# Patient Record
Sex: Female | Born: 1975
Health system: Southern US, Community
[De-identification: ages and names within clinical notes are randomized; demographics above are authoritative.]

## PROBLEM LIST (undated history)

## (undated) DIAGNOSIS — Z789 Other specified health status: Secondary | ICD-10-CM

## (undated) DIAGNOSIS — I739 Peripheral vascular disease, unspecified: Secondary | ICD-10-CM

## (undated) HISTORY — DX: Peripheral vascular disease, unspecified: I73.9

## (undated) HISTORY — DX: Other specified health status: Z78.9

---

## 2009-03-30 ENCOUNTER — Inpatient Hospital Stay: Payer: Self-pay | Admitting: Obstetrics and Gynecology

## 2009-05-12 ENCOUNTER — Ambulatory Visit: Payer: Self-pay | Admitting: Obstetrics and Gynecology

## 2009-05-14 ENCOUNTER — Ambulatory Visit: Payer: Self-pay | Admitting: Obstetrics and Gynecology

## 2009-07-12 ENCOUNTER — Emergency Department: Payer: Self-pay | Admitting: Emergency Medicine

## 2009-08-05 ENCOUNTER — Emergency Department: Payer: Self-pay | Admitting: Emergency Medicine

## 2010-10-25 ENCOUNTER — Ambulatory Visit: Payer: Self-pay | Admitting: Internal Medicine

## 2011-06-06 ENCOUNTER — Emergency Department: Payer: Self-pay | Admitting: Emergency Medicine

## 2011-06-06 LAB — URINALYSIS, COMPLETE
Bilirubin,UR: NEGATIVE
Blood: NEGATIVE
Leukocyte Esterase: NEGATIVE
Nitrite: NEGATIVE
Protein: NEGATIVE
RBC,UR: 10 /HPF (ref 0–5)
Specific Gravity: 1.021 (ref 1.003–1.030)
Squamous Epithelial: 1
WBC UR: 1 /HPF (ref 0–5)

## 2011-06-06 LAB — PREGNANCY, URINE: Pregnancy Test, Urine: NEGATIVE m[IU]/mL

## 2012-03-16 ENCOUNTER — Emergency Department: Payer: Self-pay | Admitting: Emergency Medicine

## 2012-03-16 LAB — COMPREHENSIVE METABOLIC PANEL
Anion Gap: 5 — ABNORMAL LOW (ref 7–16)
Calcium, Total: 8.6 mg/dL (ref 8.5–10.1)
EGFR (African American): >60
EGFR (Non-African Amer.): >60
Glucose: 92 mg/dL (ref 65–99)
Osmolality: 272 (ref 275–301)
SGOT(AST): 18 U/L (ref 15–37)
SGPT (ALT): 21 U/L (ref 12–78)
Sodium: 136 mmol/L (ref 136–145)
Total Protein: 7.6 g/dL (ref 6.4–8.2)

## 2012-03-16 LAB — URINALYSIS, COMPLETE
Bacteria: NONE SEEN
Bilirubin,UR: NEGATIVE
Glucose,UR: NEGATIVE mg/dL (ref 0–75)
Ketone: NEGATIVE
Nitrite: NEGATIVE
Protein: NEGATIVE
RBC,UR: 5 /HPF (ref 0–5)
Specific Gravity: 1.02 (ref 1.003–1.030)
Squamous Epithelial: 4
WBC UR: 1 /HPF (ref 0–5)

## 2012-03-16 LAB — CBC
HGB: 14.2 g/dL (ref 12.0–16.0)
MCH: 30.2 pg (ref 26.0–34.0)
RBC: 4.7 10*6/uL (ref 3.80–5.20)
WBC: 10.2 10*3/uL (ref 3.6–11.0)

## 2012-03-16 LAB — PREGNANCY, URINE: Pregnancy Test, Urine: NEGATIVE m[IU]/mL

## 2012-07-15 ENCOUNTER — Ambulatory Visit: Payer: Self-pay | Admitting: Internal Medicine

## 2014-05-20 ENCOUNTER — Other Ambulatory Visit: Payer: Self-pay | Admitting: Obstetrics and Gynecology

## 2014-05-20 DIAGNOSIS — Z1231 Encounter for screening mammogram for malignant neoplasm of breast: Secondary | ICD-10-CM

## 2014-06-17 ENCOUNTER — Ambulatory Visit
Admission: RE | Admit: 2014-06-17 | Discharge: 2014-06-17 | Disposition: A | Discharge status: Home / Self Care | Payer: 59 | Source: Ambulatory Visit | Attending: Obstetrics and Gynecology | Admitting: Obstetrics and Gynecology

## 2014-06-17 DIAGNOSIS — Z1231 Encounter for screening mammogram for malignant neoplasm of breast: Secondary | ICD-10-CM | POA: Insufficient documentation

## 2015-08-20 ENCOUNTER — Other Ambulatory Visit: Payer: Self-pay | Admitting: Internal Medicine

## 2015-08-20 DIAGNOSIS — Z1231 Encounter for screening mammogram for malignant neoplasm of breast: Secondary | ICD-10-CM

## 2015-09-07 ENCOUNTER — Other Ambulatory Visit: Payer: Self-pay | Admitting: Internal Medicine

## 2015-09-07 ENCOUNTER — Ambulatory Visit
Admission: RE | Admit: 2015-09-07 | Discharge: 2015-09-07 | Disposition: A | Discharge status: Home / Self Care | Payer: 59 | Source: Ambulatory Visit | Attending: Internal Medicine | Admitting: Internal Medicine

## 2015-09-07 DIAGNOSIS — Z1231 Encounter for screening mammogram for malignant neoplasm of breast: Secondary | ICD-10-CM | POA: Diagnosis not present

## 2016-06-05 DIAGNOSIS — B9689 Other specified bacterial agents as the cause of diseases classified elsewhere: Secondary | ICD-10-CM | POA: Diagnosis not present

## 2016-06-05 DIAGNOSIS — J019 Acute sinusitis, unspecified: Secondary | ICD-10-CM | POA: Diagnosis not present

## 2016-06-05 DIAGNOSIS — R52 Pain, unspecified: Secondary | ICD-10-CM | POA: Diagnosis not present

## 2016-06-05 DIAGNOSIS — A499 Bacterial infection, unspecified: Secondary | ICD-10-CM | POA: Diagnosis not present

## 2016-06-09 DIAGNOSIS — J019 Acute sinusitis, unspecified: Secondary | ICD-10-CM | POA: Diagnosis not present

## 2016-06-09 DIAGNOSIS — B9689 Other specified bacterial agents as the cause of diseases classified elsewhere: Secondary | ICD-10-CM | POA: Diagnosis not present

## 2016-08-07 DIAGNOSIS — Z3200 Encounter for pregnancy test, result unknown: Secondary | ICD-10-CM | POA: Diagnosis not present

## 2016-08-07 DIAGNOSIS — R112 Nausea with vomiting, unspecified: Secondary | ICD-10-CM | POA: Diagnosis not present

## 2016-08-17 DIAGNOSIS — R1084 Generalized abdominal pain: Secondary | ICD-10-CM | POA: Diagnosis not present

## 2016-08-17 DIAGNOSIS — K219 Gastro-esophageal reflux disease without esophagitis: Secondary | ICD-10-CM | POA: Diagnosis not present

## 2016-10-20 ENCOUNTER — Other Ambulatory Visit: Payer: Self-pay | Admitting: Internal Medicine

## 2016-10-20 DIAGNOSIS — Z1231 Encounter for screening mammogram for malignant neoplasm of breast: Secondary | ICD-10-CM

## 2016-11-02 ENCOUNTER — Ambulatory Visit
Admission: RE | Admit: 2016-11-02 | Discharge: 2016-11-02 | Disposition: A | Discharge status: Home / Self Care | Payer: BLUE CROSS/BLUE SHIELD | Source: Ambulatory Visit | Attending: Internal Medicine | Admitting: Internal Medicine

## 2016-11-02 DIAGNOSIS — Z1231 Encounter for screening mammogram for malignant neoplasm of breast: Secondary | ICD-10-CM

## 2016-11-09 DIAGNOSIS — Z23 Encounter for immunization: Secondary | ICD-10-CM | POA: Diagnosis not present

## 2016-12-28 DIAGNOSIS — Z1211 Encounter for screening for malignant neoplasm of colon: Secondary | ICD-10-CM | POA: Diagnosis not present

## 2016-12-28 DIAGNOSIS — Z01419 Encounter for gynecological examination (general) (routine) without abnormal findings: Secondary | ICD-10-CM | POA: Diagnosis not present

## 2017-01-13 DIAGNOSIS — J019 Acute sinusitis, unspecified: Secondary | ICD-10-CM | POA: Diagnosis not present

## 2017-03-06 DIAGNOSIS — Z0000 Encounter for general adult medical examination without abnormal findings: Secondary | ICD-10-CM | POA: Diagnosis not present

## 2017-03-06 DIAGNOSIS — J329 Chronic sinusitis, unspecified: Secondary | ICD-10-CM | POA: Diagnosis not present

## 2017-03-06 DIAGNOSIS — Z13220 Encounter for screening for lipoid disorders: Secondary | ICD-10-CM | POA: Diagnosis not present

## 2017-03-06 DIAGNOSIS — Z79899 Other long term (current) drug therapy: Secondary | ICD-10-CM | POA: Diagnosis not present

## 2017-03-06 DIAGNOSIS — Z9109 Other allergy status, other than to drugs and biological substances: Secondary | ICD-10-CM | POA: Diagnosis not present

## 2017-04-09 ENCOUNTER — Encounter (INDEPENDENT_AMBULATORY_CARE_PROVIDER_SITE_OTHER): Payer: Self-pay | Admitting: Vascular Surgery

## 2017-04-09 ENCOUNTER — Ambulatory Visit (INDEPENDENT_AMBULATORY_CARE_PROVIDER_SITE_OTHER): Payer: BLUE CROSS/BLUE SHIELD | Admitting: Vascular Surgery

## 2017-04-09 VITALS — BP 116/72 | HR 74 | Resp 18 | Ht 61.0 in | Wt 157.0 lb

## 2017-04-09 DIAGNOSIS — I83813 Varicose veins of bilateral lower extremities with pain: Secondary | ICD-10-CM

## 2017-04-09 DIAGNOSIS — R600 Localized edema: Secondary | ICD-10-CM | POA: Diagnosis not present

## 2017-04-09 NOTE — Progress Notes (Signed)
Subjective:    Patient ID: Kelly Mckenzie, female    DOB: 12/14/1975, 42 y.o.   MRN: [id number] Chief Complaint  Patient presents with  . New Patient (Initial Visit)    ref Judithann SheenSparks for Varicose Veins   Presents as a new patient referred by Dr. Judithann SheenSparks for evaluation of bilateral varicose veins.  The patient endorses a history of being seen by our practice approximately 5 years ago however never followed up to review her ultrasound results.  The patient presents today with a chief complaint of an increase in the number of varicosities located to her bilateral lower extremity.  The patient also notes her varicosities have become progressively more painful.  Her varicosities increase in size and discomfort towards the end of the day or with standing and sitting for long periods of time.  The patient denies any claudication-like symptoms, rest pain or ulceration to the bilateral lower extremity.  The patient not engage in conservative therapy at this time.  The patient does experience some mild bilateral edema to lower extremities.  This edema is also associated with some discomfort which seems to worsen towards the end of the day and with sitting and standing for long periods of time.  The patient denies any surgery or trauma to the bilateral lower extremity.  The patient denies any fever, nausea vomiting.   Review of Systems  Constitutional: Negative.   HENT: Negative.   Eyes: Negative.   Cardiovascular:       Painful varicose veins Lower extremity edema  Gastrointestinal: Negative.   Endocrine: Negative.   Genitourinary: Negative.   Musculoskeletal: Negative.   Allergic/Immunologic: Negative.   Neurological: Negative.   Hematological: Negative.   Psychiatric/Behavioral: Negative.       Objective:   Physical Exam  Constitutional: She is oriented to person, place, and time. She appears well-developed and well-nourished. No distress.  HENT:  Head: Normocephalic and atraumatic.    Eyes: Conjunctivae are normal. Pupils are equal, round, and reactive to light.  Neck: Normal range of motion.  Cardiovascular: Normal rate, regular rhythm, normal heart sounds and intact distal pulses.  Pulses:      Radial pulses are 2+ on the right side, and 2+ on the left side.       Dorsalis pedis pulses are 2+ on the right side, and 2+ on the left side.       Posterior tibial pulses are 2+ on the right side, and 2+ on the left side.  Pulmonary/Chest: Effort normal and breath sounds normal.  Musculoskeletal: Normal range of motion. She exhibits no edema (No edema noted to the bilateral lower extremity).  Neurological: She is alert and oriented to person, place, and time.  Skin: She is not diaphoretic.  Greater than 1 cm and less than 1 cm varicosities noted to the bilateral lower extremities.  These are diffuse.  There is no stasis dermatitis, skin changes, ulcerations or cellulitis noted to the bilateral lower extremity  Psychiatric: She has a normal mood and affect. Her behavior is normal. Judgment and thought content normal.  Vitals reviewed.  BP 116/72 (BP Location: Right Arm)   Pulse 74   Resp 18   Ht 5\' 1"  (1.549 m)   Wt 157 lb (71.2 kg)   BMI 29.66 kg/m   Past Medical History:  Diagnosis Date  . Medical history non-contributory    Social History   Socioeconomic History  . Marital status: Married    Spouse name: Not on file  .  Number of children: Not on file  . Years of education: Not on file  . Highest education level: Not on file  Social Needs  . Financial resource strain: Not on file  . Food insecurity - worry: Not on file  . Food insecurity - inability: Not on file  . Transportation needs - medical: Not on file  . Transportation needs - non-medical: Not on file  Occupational History  . Not on file  Tobacco Use  . Smoking status: Never Smoker  . Smokeless tobacco: Never Used  Substance and Sexual Activity  . Alcohol use: No    Frequency: Never  . Drug  use: No  . Sexual activity: Not on file  Other Topics Concern  . Not on file  Social History Narrative  . Not on file   Past Surgical History:  Procedure Laterality Date  . CESAREAN SECTION     Family History  Problem Relation Age of Onset  . Breast cancer Neg Hx    No Known Allergies     Assessment & Plan:  Presents as a new patient referred by Dr. Judithann Sheen for evaluation of bilateral varicose veins.  The patient endorses a history of being seen by our practice approximately 5 years ago however never followed up to review her ultrasound results.  The patient presents today with a chief complaint of an increase in the number of varicosities located to her bilateral lower extremity.  The patient also notes her varicosities have become progressively more painful.  Her varicosities increase in size and discomfort towards the end of the day or with standing and sitting for long periods of time.  The patient denies any claudication-like symptoms, rest pain or ulceration to the bilateral lower extremity.  The patient not engage in conservative therapy at this time.  The patient does experience some mild bilateral edema to lower extremities.  This edema is also associated with some discomfort which seems to worsen towards the end of the day and with sitting and standing for long periods of time.  The patient denies any surgery or trauma to the bilateral lower extremity.  The patient denies any fever, nausea vomiting.  1. Varicose veins of bilateral lower extremities with pain - New The patient was encouraged to wear graduated compression stockings (20-30 mmHg) on a daily basis. The patient was instructed to begin wearing the stockings first thing in the morning and removing them in the evening. The patient was instructed specifically not to sleep in the stockings. Prescription given. Patient was given information to purchase her stockings at a conventional medical supply store, [email or web address]  [facility] [employer]  [facility]. In addition, behavioral modification including elevation during the day will be initiated. Anti-inflammatories for pain. The patient will follow up in one months to asses conservative management.  The patient is adamant about reviewing her results with a physician.  I will schedule the patient to follow up after undergoing a bilateral venous duplex to rule out any contributing venous disease with Dr. Gilda Crease. The patient was instructed to call the office in the interim if any worsening edema or ulcerations to the legs, feet or toes occurs. The patient expresses their understanding.  - VAS [place] LOWER EXTREMITY VENOUS REFLUX; Future  2. Bilateral lower extremity edema - New As above  No current outpatient medications on file prior to visit.   No current facility-administered medications on file prior to visit.    There are no Patient Instructions on file for this visit. No Follow-up on  file.  Tonette Lederer, PA-C

## 2017-06-04 ENCOUNTER — Encounter (INDEPENDENT_AMBULATORY_CARE_PROVIDER_SITE_OTHER): Payer: Self-pay | Admitting: Vascular Surgery

## 2017-06-04 ENCOUNTER — Ambulatory Visit (INDEPENDENT_AMBULATORY_CARE_PROVIDER_SITE_OTHER): Payer: BLUE CROSS/BLUE SHIELD | Admitting: Vascular Surgery

## 2017-06-04 ENCOUNTER — Ambulatory Visit (INDEPENDENT_AMBULATORY_CARE_PROVIDER_SITE_OTHER): Payer: BLUE CROSS/BLUE SHIELD

## 2017-06-04 DIAGNOSIS — I872 Venous insufficiency (chronic) (peripheral): Secondary | ICD-10-CM | POA: Diagnosis not present

## 2017-06-04 DIAGNOSIS — I83813 Varicose veins of bilateral lower extremities with pain: Secondary | ICD-10-CM

## 2017-06-04 DIAGNOSIS — M79605 Pain in left leg: Secondary | ICD-10-CM

## 2017-06-04 DIAGNOSIS — M79604 Pain in right leg: Secondary | ICD-10-CM | POA: Diagnosis not present

## 2017-06-05 ENCOUNTER — Encounter (INDEPENDENT_AMBULATORY_CARE_PROVIDER_SITE_OTHER): Payer: Self-pay | Admitting: Vascular Surgery

## 2017-06-05 DIAGNOSIS — I83813 Varicose veins of bilateral lower extremities with pain: Secondary | ICD-10-CM | POA: Insufficient documentation

## 2017-06-05 DIAGNOSIS — I872 Venous insufficiency (chronic) (peripheral): Secondary | ICD-10-CM | POA: Insufficient documentation

## 2017-06-05 DIAGNOSIS — M79606 Pain in leg, unspecified: Secondary | ICD-10-CM | POA: Insufficient documentation

## 2017-06-05 NOTE — Progress Notes (Signed)
MRN : [id number]  Kelly Mckenzie is a 42 y.o. (1975-02-09) female who presents with chief complaint of  Chief Complaint  Patient presents with  . Follow-up    Bilateral Reflux studies  .  History of Present Illness:   The patient returns for followup evaluation 3 months after the initial visit. The patient continues to have pain in the lower extremities with dependency. The pain is lessened with elevation. Graduated compression stockings, Class I (20-30 mmHg), have been worn but the stockings do not eliminate the leg pain. Over-the-counter analgesics do not improve the symptoms. The degree of discomfort continues to interfere with daily activities. The patient notes the pain in the legs is causing problems with daily exercise, at the workplace and even with household activities and maintenance such as standing in the kitchen preparing meals and doing dishes.   Venous ultrasound shows normal deep venous system, no evidence of acute or chronic DVT.  Superficial reflux is present in the left great saphenous vein.    Current Meds  Medication Sig  . fluticasone (FLONASE) 50 MCG/ACT nasal spray Place into the nose.  . ibuprofen (ADVIL,MOTRIN) 200 MG tablet Take by mouth.  . Multiple Vitamin (MULTI-VITAMINS) TABS Take by mouth.    Past Medical History:  Diagnosis Date  . Medical history non-contributory   . Peripheral vascular disease [facility])     Past Surgical History:  Procedure Laterality Date  . CESAREAN SECTION      Social History Social History   Tobacco Use  . Smoking status: Never Smoker  . Smokeless tobacco: Never Used  Substance Use Topics  . Alcohol use: No    Frequency: Never  . Drug use: No    Family History Family History  Problem Relation Age of Onset  . Breast cancer Neg Hx     No Known Allergies   REVIEW OF SYSTEMS (Negative unless checked)  Constitutional: Weight loss  Fever  Chills Cardiac: Chest pain   Chest pressure    Palpitations   Shortness of breath when laying flat   Shortness of breath with exertion. Vascular:  Pain in legs with walking   Pain in legs with standing  History of DVT   Phlebitis   Swelling in legs   Varicose veins   Non-healing ulcers Pulmonary:   Uses home oxygen   Productive cough   Hemoptysis   Wheeze  COPD   Asthma Neurologic:  Dizziness   Seizures   History of stroke   History of TIA  Aphasia   Vissual changes   Weakness or numbness in arm   Weakness or numbness in leg Musculoskeletal:   Joint swelling   Joint pain   Low back pain Hematologic:  Easy bruising  Easy bleeding   Hypercoagulable state   Anemic Gastrointestinal:  Diarrhea   Vomiting  Gastroesophageal reflux/heartburn   Difficulty swallowing. Genitourinary:  Chronic kidney disease   Difficult urination  Frequent urination   Blood in urine Skin:  Rashes   Ulcers  Psychological:  History of anxiety    History of major depression.  Physical Examination  Vitals:   06/04/17 1547  BP: 116/72  Pulse: 67  Resp: 13  Weight: 157 lb (71.2 kg)  Height:  (1.549 m)   Body mass index is 29.66 kg/m. Gen: WD/WN, NAD Head: [place]/AT, No temporalis wasting.  Ear/Nose/Throat: Hearing grossly intact, nares w/o erythema or drainage Eyes: PER, EOMI, sclera nonicteric.  Neck: Supple, no large masses.   Pulmonary:  Good air movement, no audible wheezing bilaterally, no use of accessory muscles.  Cardiac: RRR, no JVD Vascular: Large varicosities present extensively greater than 10 mm bilaterally.  Mild venous stasis changes to the legs bilaterally.  2+ soft pitting edema Vessel Right Left  Radial Palpable Palpable  Gastrointestinal: Non-distended. No guarding/no peritoneal signs.  Musculoskeletal: M/S 5/5 throughout.  No deformity or atrophy.  Neurologic: CN 2-12 intact. Symmetrical.  Speech is fluent. Motor exam as listed above. Psychiatric:  Judgment intact, Mood & affect appropriate for pt's clinical situation. Dermatologic: moderate venous rashes no ulcers noted.  No changes consistent with cellulitis. Lymph : No lichenification or skin changes of chronic lymphedema.  CBC Lab Results  Component Value Date   WBC 10.2 03/16/2012   HGB 14.2 03/16/2012   HCT 41.6 03/16/2012   MCV 88 03/16/2012   PLT 322 03/16/2012    BMET    Component Value Date/Time   NA 136 03/16/2012 0858   K 3.9 03/16/2012 0858   CL 104 03/16/2012 0858   CO2 27 03/16/2012 0858   GLUCOSE 92 03/16/2012 0858   BUN 13 03/16/2012 0858   CREATININE 0.49 (L) 03/16/2012 0858   CALCIUM 8.6 03/16/2012 0858   GFRNONAA >60 03/16/2012 0858   GFRAA >60 03/16/2012 0858   CrCl cannot be calculated (Patient's most recent lab result is older than the maximum 21 days allowed.).  COAG No results found for: INR, PROTIME  Radiology No results found.  Assessment/Plan 1. Varicose veins of both lower extremities with pain Recommend  I have reviewed my previous  discussion with the patient regarding  varicose veins and why they cause symptoms. Patient will continue  wearing graduated compression stockings class 1 on a daily basis, beginning first thing in the morning and removing them in the evening.    In addition, behavioral modification including elevation during the day was again discussed and this will continue.  The patient has utilized over the counter pain medications and has been exercising.  However, at this time conservative therapy has not alleviated the patient's symptoms of leg pain and swelling  Recommend: laser ablation of the left great saphenous veins to eliminate the symptoms of pain and swelling of the lower extremities caused by the severe superficial venous reflux disease.   2. Pain in both lower extremities See #1  3. Chronic venous insufficiency No surgery or intervention at this point in time.    I have had a long discussion with  the patient regarding venous insufficiency and why it  causes symptoms. I have discussed with the patient the chronic skin changes that accompany venous insufficiency and the long term sequela such as infection and ulceration.  Patient will begin wearing graduated compression stockings class 1 (20-30 mmHg) or compression wraps on a daily basis a prescription was given. The patient will put the stockings on first thing in the morning and removing them in the evening. The patient is instructed specifically not to sleep in the stockings.    In addition, behavioral modification including several periods of elevation of the lower extremities during the day will be continued. I have demonstrated that proper elevation is a position with the ankles at heart level.  The patient is instructed to begin routine exercise, especially walking on a daily basis  I will reassess the degree of swelling and the control that graduated compression stockings or compression wraps  is offering.   The patient can be assessed for a Lymph Pump at that time if  needed   Levora Dredge, MD  06/05/2017 10:49 AM

## 2017-07-09 ENCOUNTER — Other Ambulatory Visit (INDEPENDENT_AMBULATORY_CARE_PROVIDER_SITE_OTHER): Payer: BLUE CROSS/BLUE SHIELD | Admitting: Vascular Surgery

## 2017-07-12 ENCOUNTER — Encounter (INDEPENDENT_AMBULATORY_CARE_PROVIDER_SITE_OTHER): Payer: BLUE CROSS/BLUE SHIELD

## 2017-07-26 ENCOUNTER — Encounter (INDEPENDENT_AMBULATORY_CARE_PROVIDER_SITE_OTHER): Payer: Self-pay

## 2017-07-26 ENCOUNTER — Ambulatory Visit (INDEPENDENT_AMBULATORY_CARE_PROVIDER_SITE_OTHER): Payer: BLUE CROSS/BLUE SHIELD | Admitting: Vascular Surgery

## 2017-07-26 ENCOUNTER — Encounter (INDEPENDENT_AMBULATORY_CARE_PROVIDER_SITE_OTHER): Payer: Self-pay | Admitting: Vascular Surgery

## 2017-07-26 VITALS — BP 111/69 | HR 60 | Resp 16 | Ht 61.0 in | Wt 155.4 lb

## 2017-07-26 DIAGNOSIS — I872 Venous insufficiency (chronic) (peripheral): Secondary | ICD-10-CM

## 2017-07-30 ENCOUNTER — Encounter (INDEPENDENT_AMBULATORY_CARE_PROVIDER_SITE_OTHER): Payer: BLUE CROSS/BLUE SHIELD

## 2017-08-09 ENCOUNTER — Encounter (INDEPENDENT_AMBULATORY_CARE_PROVIDER_SITE_OTHER): Payer: Self-pay | Admitting: Vascular Surgery

## 2017-08-09 ENCOUNTER — Ambulatory Visit (INDEPENDENT_AMBULATORY_CARE_PROVIDER_SITE_OTHER): Payer: BLUE CROSS/BLUE SHIELD | Admitting: Vascular Surgery

## 2017-08-09 VITALS — BP 109/65 | HR 80 | Resp 17 | Ht 61.0 in | Wt 157.4 lb

## 2017-08-09 DIAGNOSIS — I83813 Varicose veins of bilateral lower extremities with pain: Secondary | ICD-10-CM

## 2017-08-09 DIAGNOSIS — M79604 Pain in right leg: Secondary | ICD-10-CM | POA: Diagnosis not present

## 2017-08-09 DIAGNOSIS — I872 Venous insufficiency (chronic) (peripheral): Secondary | ICD-10-CM | POA: Diagnosis not present

## 2017-08-09 DIAGNOSIS — M79605 Pain in left leg: Secondary | ICD-10-CM

## 2017-08-10 ENCOUNTER — Encounter (INDEPENDENT_AMBULATORY_CARE_PROVIDER_SITE_OTHER): Payer: Self-pay | Admitting: Vascular Surgery

## 2017-08-10 NOTE — Progress Notes (Signed)
MRN : [id number]  Kelly Mckenzie is a 42 y.o. (16-Jan-1976) female who presents with chief complaint of  Chief Complaint  Patient presents with  . Follow-up  .  History of Present Illness: The patient returns to the office for followup status post attempted laser ablation of the left saphenous vein.  At the time of this treatment the great saphenous vein was noted to be previously ablated and the prelaser ultrasound to be inaccurate.  The patient note significant improvement in the lower extremity pain but not resolution of the symptoms. The patient notes multiple residual varicosities bilaterally which continued to hurt with dependent positions and remained tender to palpation. The patient's swelling is minimally from preoperative status. The patient continues to wear graduated compression stockings on a daily basis but these are not eliminating the pain and discomfort. The patient continues to use over-the-counter anti-inflammatory medications to treat the pain and related symptoms but this has not given the patient relief. The patient notes the pain in the lower extremities is causing problems with daily exercise, problems at work and even with household activities such as preparing meals and doing dishes.  The patient is otherwise done well and there have been no complications related to the laser procedure or interval changes in the patient's overall     Current Meds  Medication Sig  . fluticasone (FLONASE) 50 MCG/ACT nasal spray Place into the nose.  . ibuprofen (ADVIL,MOTRIN) 200 MG tablet Take by mouth.  . Multiple Vitamin (MULTI-VITAMINS) TABS Take by mouth.    Past Medical History:  Diagnosis Date  . Medical history non-contributory   . Peripheral vascular disease [facility])     Past Surgical History:  Procedure Laterality Date  . CESAREAN SECTION      Social History Social History   Tobacco Use  . Smoking status: Never Smoker  . Smokeless tobacco: Never Used    Substance Use Topics  . Alcohol use: No    Frequency: Never  . Drug use: No    Family History Family History  Problem Relation Age of Onset  . Breast cancer Neg Hx     No Known Allergies   REVIEW OF SYSTEMS (Negative unless checked)  Constitutional: [] Weight loss  [] Fever  [] Chills Cardiac: [] Chest pain   [] Chest pressure   [] Palpitations   [] Shortness of breath when laying flat   [] Shortness of breath with exertion. Vascular:  [] Pain in legs with walking   [x] Pain in legs with dependency  [] History of DVT   [] Phlebitis   [x] Swelling in legs   [x] Varicose veins   [] Non-healing ulcers Pulmonary:   [] Uses home oxygen   [] Productive cough   [] Hemoptysis   [] Wheeze  [] COPD   [] Asthma Neurologic:  [] Dizziness   [] Seizures   [] History of stroke   [] History of TIA  [] Aphasia   [] Vissual changes   [] Weakness or numbness in arm   [] Weakness or numbness in leg Musculoskeletal:   [] Joint swelling   [] Joint pain   [] Low back pain Hematologic:  [] Easy bruising  [] Easy bleeding   [] Hypercoagulable state   [] Anemic Gastrointestinal:  [] Diarrhea   [] Vomiting  [] Gastroesophageal reflux/heartburn   [] Difficulty swallowing. Genitourinary:  [] Chronic kidney disease   [] Difficult urination  [] Frequent urination   [] Blood in urine Skin:  [x] Rashes   [] Ulcers  Psychological:  [] History of anxiety   []  History of major depression.  Physical Examination  Vitals:   08/09/17 1558  BP: 109/65  Pulse: 80  Resp: 17  Weight:  157 lb 6.4 oz (71.4 kg)  Height: 5\' 1"  (1.549 m)   Body mass index is 29.74 kg/m. Gen: WD/WN, NAD Head: [place]/AT, No temporalis wasting.  Ear/Nose/Throat: Hearing grossly intact, nares w/o erythema or drainage Eyes: PER, EOMI, sclera nonicteric.  Neck: Supple, no large masses.   Pulmonary:  Good air movement, no audible wheezing bilaterally, no use of accessory muscles.  Cardiac: RRR, no JVD Vascular: Large varicosities present extensively greater than 10 mm left greater than  right.  Mild venous stasis changes to the legs bilaterally.  2+ soft pitting edema Vessel Right Left  Radial Palpable Palpable  PT Palpable Palpable  DP Palpable Palpable  Gastrointestinal: Non-distended. No guarding/no peritoneal signs.  Musculoskeletal: M/S 5/5 throughout.  No deformity or atrophy.  Neurologic: CN 2-12 intact. Symmetrical.  Speech is fluent. Motor exam as listed above. Psychiatric: Judgment intact, Mood & affect appropriate for pt's clinical situation. Dermatologic: Venous rashes no ulcers noted.  No changes consistent with cellulitis. Lymph : No lichenification or skin changes of chronic lymphedema.  CBC Lab Results  Component Value Date   WBC 10.2 03/16/2012   HGB 14.2 03/16/2012   HCT 41.6 03/16/2012   MCV 88 03/16/2012   PLT 322 03/16/2012    BMET    Component Value Date/Time   NA 136 03/16/2012 0858   K 3.9 03/16/2012 0858   CL 104 03/16/2012 0858   CO2 27 03/16/2012 0858   GLUCOSE 92 03/16/2012 0858   BUN 13 03/16/2012 0858   CREATININE 0.49 (L) 03/16/2012 0858   CALCIUM 8.6 03/16/2012 0858   GFRNONAA >60 03/16/2012 0858   GFRAA >60 03/16/2012 0858   CrCl cannot be calculated (Patient's most recent lab result is older than the maximum 21 days allowed.).  COAG No results found for: INR, PROTIME  Radiology No results found.    Assessment/Plan 1. Varicose veins of both lower extremities with pain Recommend:  The patient has had successful ablation of the previously incompetent saphenous venous system but still has persistent symptoms of pain and swelling that are having a negative impact on daily life and daily activities.  Patient should undergo injection sclerotherapy to treat the residual varicosities.  The risks, benefits and alternative therapies were reviewed in detail with the patient.  All questions were answered.  The patient agrees to proceed with sclerotherapy at their convenience.  The patient will continue wearing the  graduated compression stockings and using the over-the-counter pain medications to treat her symptoms.     2. Chronic venous insufficiency No surgery or intervention at this point in time.    I have had a long discussion with the patient regarding venous insufficiency and why it  causes symptoms. I have discussed with the patient the chronic skin changes that accompany venous insufficiency and the long term sequela such as infection and ulceration.  Patient will begin wearing graduated compression stockings class 1 (20-30 mmHg) or compression wraps on a daily basis a prescription was given. The patient will put the stockings on first thing in the morning and removing them in the evening. The patient is instructed specifically not to sleep in the stockings.    In addition, behavioral modification including several periods of elevation of the lower extremities during the day will be continued. I have demonstrated that proper elevation is a position with the ankles at heart level.  The patient is instructed to begin routine exercise, especially walking on a daily basis   3. Pain in both lower extremities See #  1&2    Levora Dredge, MD  08/10/2017 6:16 PM

## 2017-08-31 ENCOUNTER — Encounter (INDEPENDENT_AMBULATORY_CARE_PROVIDER_SITE_OTHER): Payer: Self-pay | Admitting: Vascular Surgery

## 2017-08-31 NOTE — Progress Notes (Signed)
Appointment was canceled.

## 2017-09-29 DIAGNOSIS — J36 Peritonsillar abscess: Secondary | ICD-10-CM | POA: Diagnosis not present

## 2017-10-22 ENCOUNTER — Other Ambulatory Visit: Payer: Self-pay | Admitting: Internal Medicine

## 2017-10-22 DIAGNOSIS — Z1231 Encounter for screening mammogram for malignant neoplasm of breast: Secondary | ICD-10-CM

## 2017-11-05 ENCOUNTER — Ambulatory Visit
Admission: RE | Admit: 2017-11-05 | Discharge: 2017-11-05 | Disposition: A | Discharge status: Home / Self Care | Payer: BLUE CROSS/BLUE SHIELD | Source: Ambulatory Visit | Attending: Internal Medicine | Admitting: Internal Medicine

## 2017-11-05 DIAGNOSIS — Z1231 Encounter for screening mammogram for malignant neoplasm of breast: Secondary | ICD-10-CM | POA: Diagnosis not present

## 2017-11-09 DIAGNOSIS — Z23 Encounter for immunization: Secondary | ICD-10-CM | POA: Diagnosis not present

## 2017-11-29 DIAGNOSIS — N898 Other specified noninflammatory disorders of vagina: Secondary | ICD-10-CM | POA: Diagnosis not present

## 2017-11-29 DIAGNOSIS — R300 Dysuria: Secondary | ICD-10-CM | POA: Diagnosis not present

## 2017-11-29 DIAGNOSIS — R1030 Lower abdominal pain, unspecified: Secondary | ICD-10-CM | POA: Diagnosis not present

## 2018-03-07 DIAGNOSIS — Z13220 Encounter for screening for lipoid disorders: Secondary | ICD-10-CM | POA: Diagnosis not present

## 2018-03-07 DIAGNOSIS — Z8679 Personal history of other diseases of the circulatory system: Secondary | ICD-10-CM | POA: Diagnosis not present

## 2018-03-07 DIAGNOSIS — M79604 Pain in right leg: Secondary | ICD-10-CM | POA: Diagnosis not present

## 2018-03-07 DIAGNOSIS — Z0000 Encounter for general adult medical examination without abnormal findings: Secondary | ICD-10-CM | POA: Diagnosis not present

## 2018-03-07 DIAGNOSIS — M79605 Pain in left leg: Secondary | ICD-10-CM | POA: Diagnosis not present

## 2018-03-07 DIAGNOSIS — Z79899 Other long term (current) drug therapy: Secondary | ICD-10-CM | POA: Diagnosis not present

## 2018-03-07 DIAGNOSIS — K219 Gastro-esophageal reflux disease without esophagitis: Secondary | ICD-10-CM | POA: Diagnosis not present

## 2018-03-07 DIAGNOSIS — Z9109 Other allergy status, other than to drugs and biological substances: Secondary | ICD-10-CM | POA: Diagnosis not present

## 2018-03-13 DIAGNOSIS — Z1211 Encounter for screening for malignant neoplasm of colon: Secondary | ICD-10-CM | POA: Diagnosis not present

## 2018-03-13 DIAGNOSIS — Z01411 Encounter for gynecological examination (general) (routine) with abnormal findings: Secondary | ICD-10-CM | POA: Diagnosis not present

## 2018-03-13 DIAGNOSIS — N852 Hypertrophy of uterus: Secondary | ICD-10-CM | POA: Diagnosis not present

## 2018-03-13 DIAGNOSIS — Z1239 Encounter for other screening for malignant neoplasm of breast: Secondary | ICD-10-CM | POA: Diagnosis not present

## 2018-03-18 ENCOUNTER — Ambulatory Visit (INDEPENDENT_AMBULATORY_CARE_PROVIDER_SITE_OTHER): Payer: BLUE CROSS/BLUE SHIELD | Admitting: Vascular Surgery

## 2018-03-18 ENCOUNTER — Encounter (INDEPENDENT_AMBULATORY_CARE_PROVIDER_SITE_OTHER): Payer: Self-pay | Admitting: Vascular Surgery

## 2018-03-18 ENCOUNTER — Encounter (INDEPENDENT_AMBULATORY_CARE_PROVIDER_SITE_OTHER): Payer: Self-pay

## 2018-03-18 ENCOUNTER — Other Ambulatory Visit: Payer: Self-pay

## 2018-03-18 VITALS — BP 156/91 | HR 67 | Resp 10 | Ht 60.0 in | Wt 151.0 lb

## 2018-03-18 DIAGNOSIS — I83813 Varicose veins of bilateral lower extremities with pain: Secondary | ICD-10-CM

## 2018-03-28 DIAGNOSIS — Z1211 Encounter for screening for malignant neoplasm of colon: Secondary | ICD-10-CM | POA: Diagnosis not present

## 2018-04-05 ENCOUNTER — Encounter (INDEPENDENT_AMBULATORY_CARE_PROVIDER_SITE_OTHER): Payer: Self-pay | Admitting: Vascular Surgery

## 2018-04-08 ENCOUNTER — Encounter (INDEPENDENT_AMBULATORY_CARE_PROVIDER_SITE_OTHER): Payer: Self-pay | Admitting: Vascular Surgery

## 2018-04-17 DIAGNOSIS — H66001 Acute suppurative otitis media without spontaneous rupture of ear drum, right ear: Secondary | ICD-10-CM | POA: Diagnosis not present

## 2018-04-17 DIAGNOSIS — J019 Acute sinusitis, unspecified: Secondary | ICD-10-CM | POA: Diagnosis not present

## 2018-04-17 DIAGNOSIS — B9689 Other specified bacterial agents as the cause of diseases classified elsewhere: Secondary | ICD-10-CM | POA: Diagnosis not present

## 2018-04-25 ENCOUNTER — Encounter (INDEPENDENT_AMBULATORY_CARE_PROVIDER_SITE_OTHER): Payer: Self-pay | Admitting: Vascular Surgery

## 2018-04-25 DIAGNOSIS — N852 Hypertrophy of uterus: Secondary | ICD-10-CM | POA: Diagnosis not present

## 2018-04-25 DIAGNOSIS — D251 Intramural leiomyoma of uterus: Secondary | ICD-10-CM | POA: Diagnosis not present

## 2018-04-25 DIAGNOSIS — N83201 Unspecified ovarian cyst, right side: Secondary | ICD-10-CM | POA: Diagnosis not present

## 2018-04-25 NOTE — Progress Notes (Signed)
Patient was a no-show 

## 2018-05-21 ENCOUNTER — Other Ambulatory Visit: Payer: Self-pay | Admitting: Obstetrics and Gynecology

## 2018-05-21 DIAGNOSIS — Z1231 Encounter for screening mammogram for malignant neoplasm of breast: Secondary | ICD-10-CM

## 2018-10-28 ENCOUNTER — Ambulatory Visit: Payer: Self-pay

## 2018-10-28 ENCOUNTER — Other Ambulatory Visit: Payer: Self-pay

## 2018-10-28 VITALS — BP 110/76 | HR 83 | Resp 15 | Ht 60.0 in | Wt 154.0 lb

## 2018-10-28 DIAGNOSIS — Z008 Encounter for other general examination: Secondary | ICD-10-CM

## 2018-10-28 DIAGNOSIS — Z23 Encounter for immunization: Secondary | ICD-10-CM

## 2018-10-28 LAB — POCT LIPID PANEL
HDL: 73
LDL: 49
Non-HDL: 107
POC Glucose: 107 mg/dl — AB (ref 70–99)
TC/HDL: 2.5
TC: 181
TRG: 291

## 2018-10-28 NOTE — Patient Instructions (Signed)
Vacuna antigripal (inactivada o recombinante): lo que debe saber Influenza (Flu) Vaccine (Inactivated or Recombinant): What You Need to Know 1. Por qu vacunarse? La vacuna contra la gripe puede prevenir la gripe. La gripe es una enfermedad contagiosa que se disemina en los [place] cada ao, por lo general, entre octubre y mayo. Cualquier persona puede contraer gripe, pero es ms peligrosa para ciertas personas. Los bebs y los nios pequeos, los mayores de 65aos, las embarazadas, as como las personas que tienen ciertas enfermedades o cuyo sistema inmunitario est debilitado corren un riesgo mayor de tener complicaciones debido a la gripe. La neumona, la bronquitis, las infecciones de los senos paranasales y las infecciones de odos son ejemplos de complicaciones relacionadas con la gripe. Si tiene una afeccin, por ejemplo, enfermedad cardaca, cncer o diabetes, la gripe puede empeorarla. La gripe puede causar fiebre y escalofros, dolor de garganta, dolores musculares, fatiga, tos, dolor de cabeza y secrecin o congestin nasal. Algunas personas pueden tener vmitos y diarrea, aunque esto es ms frecuente en los nios que en los adultos. Cada ao, miles de personas mueren en los [place] debido a la gripe, y muchas ms deben ser hospitalizadas. Cada ao, la vacuna antigripal previene millones de enfermedades y evita visitas al mdico relacionadas con la gripe. 2. Vacuna contra la gripe Los CDC (Centros para el Control y la Prevencin de Enfermedades) recomiendan que todas las personas a partir de los 6 meses de edad se vacunen cada temporada de gripe. Es posible que los nios de 6meses a 8aos deban recibir 2 dosis durante la misma temporada de gripe. Todas las dems personas tienen que aplicarse 1 sola dosis cada temporada de gripe. La vacuna comienza a surtir efecto aproximadamente 2semanas despus de su aplicacin. Hay muchos virus de la gripe, y estos mutan permanentemente.  Cada ao, se elabora una nueva vacuna antigripal para brindar proteccin contra tres o cuatro virus que probablemente causen la enfermedad en la siguiente temporada de gripe. Incluso si la vacuna no es especfica para esos virus, aun as puede brindar cierta proteccin. La vacuna contra la gripe no causa gripe. La vacuna contra la gripe puede administrarse al mismo tiempo que otras vacunas. 3. Hable con el mdico Comunquese con la persona que le coloca las vacunas si la persona que la recibe:  Ha tenido una reaccin alrgica despus de una dosis previa de la vacuna contra la gripe o tiene alguna alergia grave, potencialmente mortal.  Alguna vez tuvo sndrome de Guillain-Barr (tambin llamado SGB). En algunos casos, es posible que el mdico decida posponer la aplicacin de la vacuna contra la gripe para una visita en el futuro. Las personas que sufren trastornos menores, como un resfro, pueden vacunarse. Las personas que tienen enfermedades moderadas o graves generalmente deben esperar hasta recuperarse para poder recibir la vacuna contra la gripe. Su mdico puede darle ms informacin. 4. Riesgos de una reaccin a la vacuna  Despus de recibir la vacuna contra la gripe, puede haber dolor, enrojecimiento e hinchazn en el lugar de la inyeccin, fiebre, dolores musculares y dolor de cabeza.  Puede haber un pequeo aumento del riesgo de sufrir sndrome de Guillain-Barr (SGB) despus de la aplicacin de la vacuna contra la gripe inactivada. Los nios pequeos que reciben la vacuna antigripal junto con la vacuna antineumoccica (PCV13), o la DTaP en el mismo momento pueden tener una probabilidad un poco ms elevada de tener una convulsin debido a la fiebre. Informe al mdico si un nio que est recibiendo la   vacuna antigripal ha tenido una convulsin alguna vez. Las personas a veces se desmayan despus de procedimientos mdicos, incluida la vacunacin. Informe al mdico si se siente mareado, tiene  cambios en la visin o zumbidos en los odos. Al igual que con cualquier medicamento, existe una probabilidad muy remota de que una vacuna cause una reaccin alrgica grave, otra lesin grave o la muerte. 5. Qu pasa si se presenta un problema grave? Podra producirse una reaccin alrgica despus de que la persona vacunada abandone la clnica. Si observa signos de una reaccin alrgica grave (ronchas, hinchazn de la cara y la garganta, dificultad para respirar, latidos cardacos acelerados, mareos o debilidad), llame al 9-1-1 y lleve a la persona al hospital ms cercano. Si se presentan otros signos que le preocupan, comunquese con su mdico. Las reacciones adversas deben informarse al Sistema de Informe de Eventos Adversos de Vacunas (Vaccine Adverse Event Reporting System, VAERS). Por lo general, el mdico presenta este informe o puede hacerlo usted mismo. Visite el sitio web del VAERS en www.vaers.hhs.gov o llame al 1-800-822-7967.El VAERS es solo para informar reacciones; su personal no proporciona asesoramiento mdico. 6. Programa Nacional de Compensacin de Daos por Vacunas El Programa Nacional de Compensacin de Daos por Vacunas (National Vaccine Injury Compensation Program, VICP) es un programa federal que fue creado para compensar a las personas que puedan haber sufrido daos al recibir ciertas vacunas. Visite el sitio web del VICP en www.hrsa.gov/vaccinecompensation o llame al 1-800-338-2382 para obtener ms informacin acerca del programa y de cmo presentar un reclamo. Hay un lmite de tiempo para presentar un reclamo de compensacin. 7. Cmo puedo obtener ms informacin?  Pregntele al mdico.  Comunquese con el servicio de salud de su localidad o su estado.  Comunquese con los Centros para el Control y la Prevencin de Enfermedades (Centers for Disease Control and Prevention, CDC): ? Llame al 1-800-232-4636 (1-800-CDC-INFO) o ? Visite el sitio web de los CDC en www.cdc.gov/flu  Declaracin de informacin (provisional) sobre la vacuna contra la gripe inactivada (15/08/2017) Esta informacin no tiene como fin reemplazar el consejo del mdico. Asegrese de hacerle al mdico cualquier pregunta que tenga. Document Released: 04/14/2008 Document Revised: 09/25/2017 Document Reviewed: 09/25/2017 Elsevier Patient Education  2020 Elsevier Inc. Prevencin de la exposicin a lquidos corporales Preventing Body Fluid Exposure La sangre o los lquidos corporales, como la orina, las heces, el semen, las secreciones vaginales, la saliva o la leche materna pueden contener grmenes (bacterias o virus) que pueden causar infecciones. La mejor manera de evitar la infeccin por estos grmenes es prevenir la exposicin a los lquidos corporales. Cmo puede afectarme la exposicin a los lquidos corporales? Los grmenes se pueden propagar cuando los lquidos del cuerpo de una persona infectada entran en contacto con la boca, la nariz, los ojos, los genitales o las lesiones de la piel de otra persona. Qu puede aumentar el riesgo? Usted tiene una mayor probabilidad de quedar expuesto a lquidos corporales infectados si:  Es un trabajador sanitario o un miembro de la familia que est cuidando a una persona enferma.  Usa agujas para inyectarse drogas y comparte agujas con otros consumidores.  Tiene relaciones sexuales o participa en otras actividades sexuales sin usar un condn u otra proteccin. Qu medidas puedo tomar para prevenir la exposicin a los lquidos corporales?   Lave y desinfecte las mesadas y otras superficies regularmente.  Use un equipo protector adecuado, como guantes, batas, mscaras o antiparras cuando haya riesgo de exposicin.  Limpie todos los restos de lquidos   corporales con toallas desechables y limpie la zona con un desinfectante.  Deseche correctamente todos los derivados de la sangre y otros lquidos. Use bolsas de seguridad.  Evite volver a tapar las  agujas.  Deseche adecuadamente las agujas y otros instrumentos que tengan puntas o filos (objetos afilados). Use recipientes cerrados que estn marcados para objetos punzantes.  Evite el uso de drogas inyectables.  No comparta agujas.  Use preservativos durante las relaciones sexuales.  Use pequeas lminas de plstico (diques dentales) para cubrir la boca, la vagina o el ano a fin de reducir el riesgo de contraer el VIH u otras infecciones de transmisin sexual durante el sexo oral.  Conozca y siga todas las pautas para prevenir la exposicin (precauciones universales) proporcionadas en su lugar de trabajo. Qu medidas puedo tomar para reducir mis probabilidades de contraer una infeccin?   Lvese las manos frecuentemente con agua y jabn. Use desinfectante para manos si no dispone de agua y jabn.  Asegrese de que sus vacunas estn actualizadas, incluyendo las vacunas contra el ttanos y la hepatitis.  Evite tener mltiples parejas sexuales.  Considere la profilaxis de preexposicin (pre-exposure prophylaxis, PrEP) para el VIH si: ? Tiene una relacin continua con una pareja sexual que es VIH positivo. ? Tiene mltiples parejas sexuales y participa en relaciones sexuales sin proteccin. ? Tiene relaciones sexuales con parejas sexuales de alto riesgo.  Considere la profilaxis postexposicin para el VIH con medicamentos (antirretrovirales) despus de tener relaciones sexuales sin proteccin. Para que sea ms eficaz, esto se debe comenzar dentro de las 72 horas despus de una posible exposicin. Qu medidas puedo tomar para evitar la propagacin de la infeccin a otras personas?  Mantenga las heridas abiertas cubiertas.  Deseche todos los elementos que contengan sangre colocndolos en la basura. Esto incluye afeitadoras, tampones y vendajes.  No comparta elementos de higiene personal como cepillos de dientes, afeitadoras o hilo dental.  No comparta elementos relacionados con las  drogas con otras personas. Estos incluyen agujas, jeringas, sorbetes y pipas.  Siga todas las indicaciones de su mdico para prevenir el contagio de la infeccin. Dnde buscar ms informacin National Institute for Occupational Safety and Health (NIOSH) (Instituto Nacional de Seguridad y Salud Ocupacional): www.cdc.gov/niosh Resumen  La mejor manera de evitar la infeccin por estos grmenes propagada a travs de la sangre o lquidos corporales es prevenir la exposicin a los lquidos corporales.  Use un equipo protector adecuado, como guantes, batas, mscaras o antiparras cuando haya riesgo de exposicin.  Lvese las manos frecuentemente con agua y jabn. Use desinfectante para manos si no dispone de agua y jabn.  Siga todas las indicaciones de su mdico para prevenir el contagio de la infeccin. Esta informacin no tiene como fin reemplazar el consejo del mdico. Asegrese de hacerle al mdico cualquier pregunta que tenga. Document Released: 03/27/2018 Document Revised: 03/27/2018 Document Reviewed: 03/27/2018 Elsevier Patient Education  2020 Elsevier Inc.  

## 2018-11-08 ENCOUNTER — Ambulatory Visit
Admission: RE | Admit: 2018-11-08 | Discharge: 2018-11-08 | Disposition: A | Discharge status: Home / Self Care | Payer: BC Managed Care – PPO | Source: Ambulatory Visit | Attending: Obstetrics and Gynecology | Admitting: Obstetrics and Gynecology

## 2018-11-08 DIAGNOSIS — Z1231 Encounter for screening mammogram for malignant neoplasm of breast: Secondary | ICD-10-CM | POA: Diagnosis not present

## 2018-11-14 DIAGNOSIS — Z79899 Other long term (current) drug therapy: Secondary | ICD-10-CM | POA: Diagnosis not present

## 2018-11-14 DIAGNOSIS — Z1329 Encounter for screening for other suspected endocrine disorder: Secondary | ICD-10-CM | POA: Diagnosis not present

## 2018-11-14 DIAGNOSIS — E781 Pure hyperglyceridemia: Secondary | ICD-10-CM | POA: Diagnosis not present

## 2019-04-17 ENCOUNTER — Ambulatory Visit: Payer: BC Managed Care – PPO | Attending: Internal Medicine

## 2019-04-17 DIAGNOSIS — Z23 Encounter for immunization: Secondary | ICD-10-CM

## 2019-04-17 NOTE — Progress Notes (Signed)
   Covid-19 Vaccination Clinic  Name:  Kelly Mckenzie    MRN: [id number] DOB: [birth year]  04/17/2019  [name] was observed post Covid-19 immunization for 15 minutes without incident. She was provided with Vaccine Information Sheet and instruction to access the V-Safe system.   [name] was instructed to call 911 with any severe reactions post vaccine: Marland Kitchen Difficulty breathing  . Swelling of face and throat  . A fast heartbeat  . A bad rash all over body  . Dizziness and weakness   Immunizations Administered    Name Date Dose VIS Date Route   Pfizer COVID-19 Vaccine 04/17/2019  9:47 AM 0.3 mL 01/10/2019 Intramuscular   Manufacturer: [employer], [employer]   Lot: [id number]   NDC: [address]-7

## 2019-05-13 ENCOUNTER — Ambulatory Visit: Payer: BC Managed Care – PPO | Attending: Internal Medicine

## 2019-05-13 DIAGNOSIS — Z23 Encounter for immunization: Secondary | ICD-10-CM

## 2019-05-13 NOTE — Progress Notes (Signed)
   Covid-19 Vaccination Clinic  Name:  Kelly Mckenzie    MRN: [id number] DOB: 27-Jun-1975  05/13/2019  [name] was observed post Covid-19 immunization for 15 minutes without incident. She was provided with Vaccine Information Sheet and instruction to access the V-Safe system.   [name] was instructed to call 911 with any severe reactions post vaccine: Marland Kitchen Difficulty breathing  . Swelling of face and throat  . A fast heartbeat  . A bad rash all over body  . Dizziness and weakness   Immunizations Administered    Name Date Dose VIS Date Route   Pfizer COVID-19 Vaccine 05/13/2019  9:33 AM 0.3 mL 01/10/2019 Intramuscular   Manufacturer: [employer], [employer]   Lot: [id number]   NDC: [address]-8

## 2019-06-25 ENCOUNTER — Other Ambulatory Visit: Payer: Self-pay | Admitting: Obstetrics and Gynecology

## 2019-06-25 DIAGNOSIS — Z1231 Encounter for screening mammogram for malignant neoplasm of breast: Secondary | ICD-10-CM

## 2019-07-22 ENCOUNTER — Other Ambulatory Visit (INDEPENDENT_AMBULATORY_CARE_PROVIDER_SITE_OTHER): Payer: Self-pay | Admitting: Nurse Practitioner

## 2019-07-22 DIAGNOSIS — I83819 Varicose veins of unspecified lower extremities with pain: Secondary | ICD-10-CM

## 2019-07-23 ENCOUNTER — Ambulatory Visit (INDEPENDENT_AMBULATORY_CARE_PROVIDER_SITE_OTHER): Payer: BC Managed Care – PPO

## 2019-07-23 ENCOUNTER — Other Ambulatory Visit: Payer: Self-pay

## 2019-07-23 ENCOUNTER — Ambulatory Visit (INDEPENDENT_AMBULATORY_CARE_PROVIDER_SITE_OTHER): Payer: BLUE CROSS/BLUE SHIELD | Admitting: Nurse Practitioner

## 2019-07-23 DIAGNOSIS — I83819 Varicose veins of unspecified lower extremities with pain: Secondary | ICD-10-CM | POA: Diagnosis not present

## 2019-07-29 ENCOUNTER — Other Ambulatory Visit (INDEPENDENT_AMBULATORY_CARE_PROVIDER_SITE_OTHER): Payer: Self-pay | Admitting: Nurse Practitioner

## 2019-07-29 ENCOUNTER — Telehealth (INDEPENDENT_AMBULATORY_CARE_PROVIDER_SITE_OTHER): Payer: Self-pay | Admitting: Nurse Practitioner

## 2019-07-29 NOTE — Progress Notes (Unsigned)
Subjective:    Patient ID: Kelly Mckenzie, female    DOB: 11/15/1975, 44 y.o.   MRN: [id number] No chief complaint on file.   The patient presents today as a referral from her primary care physician Dr. Judithann Sheen in regards to the patient's lower extremity pain and discomfort.  The patient notes that she is having lots of leg pain for approximately the last 2 years or so.  The patient notes that the pain is burning and stabbing.  The pain is constant.  It happens to be in her legs and feet.  She denies having any extensive swelling of her feet.  The patient notes that her occupation requires her to stand for long periods and over the years this is gotten worse.  The patient notes that there is even some discoloration in her feet as it has progressed.  The patient notes that she has multiple spider varicosities as well.  Previously a left lower extremity endovenous laser ablation was attempted however during the procedure the great saphenous vein was not easily visualized.  She denies any hemorrhaging.  She denies any venous ulceration.  Today noninvasive studies show no evidence of DVT or superficial venous stenosis in the bilateral lower extremities.  No evidence of deep venous insufficiency seen bilaterally in the lower extremities.  No evidence of superficial venous reflux seen in either the great or short saphenous veins bilaterally.   Review of Systems  Cardiovascular:       Leg pain and soreness       Objective:   Physical Exam Neurological:     Mental Status: She is alert and oriented to person, place, and time.  Psychiatric:        Mood and Affect: Mood normal.        Behavior: Behavior normal.        Thought Content: Thought content normal.        Judgment: Judgment normal.     There were no vitals taken for this visit.  Past Medical History:  Diagnosis Date  . Medical history non-contributory   . Peripheral vascular disease (HCC)     Social History    Socioeconomic History  . Marital status: Married    Spouse name: Not on file  . Number of children: Not on file  . Years of education: Not on file  . Highest education level: Not on file  Occupational History  . Not on file  Tobacco Use  . Smoking status: Never Smoker  . Smokeless tobacco: Never Used  Substance and Sexual Activity  . Alcohol use: No  . Drug use: No  . Sexual activity: Not on file  Other Topics Concern  . Not on file  Social History Narrative  . Not on file   Social Determinants of Health   Financial Resource Strain:   . Difficulty of Paying Living Expenses:   Food Insecurity:   . Worried About Programme researcher, broadcasting/film/video in the Last Year:   . Barista in the Last Year:   Transportation Needs:   . Freight forwarder (Medical):   Marland Kitchen Lack of Transportation (Non-Medical):   Physical Activity:   . Days of Exercise per Week:   . Minutes of Exercise per Session:   Stress:   . Feeling of Stress :   Social Connections:   . Frequency of Communication with Friends and Family:   . Frequency of Social Gatherings with Friends and Family:   . Attends Religious  Services:   . Active Member of Clubs or Organizations:   . Attends Banker Meetings:   Marland Kitchen Marital Status:   Intimate Partner Violence:   . Fear of Current or Ex-Partner:   . Emotionally Abused:   Marland Kitchen Physically Abused:   . Sexually Abused:     Past Surgical History:  Procedure Laterality Date  . CESAREAN SECTION      Family History  Problem Relation Age of Onset  . Breast cancer Neg Hx     No Known Allergies     Assessment & Plan:   1. Varicose veins with pain I had a long discussion with the patient regarding her test results the consideration for varicose veins.  Based upon the patient's noninvasive studies, we would be unable to proceed with an endovenous laser ablation as there is no evidence of reflux seen in her bilateral saphenous veins.  Based on this, the patient should  proceed with conservative therapy including wearing medical grade 1 compression stockings, elevation of her lower extremities and exercise.  The patient is advised that if her varicosities worsen she is more than welcome to contact her office and we can reevaluate the progression of her venous insufficiency. - VAS [place] LOWER EXTREMITY VENOUS REFLUX   Current Outpatient Medications on File Prior to Visit  Medication Sig Dispense Refill  . fluticasone (FLONASE) 50 MCG/ACT nasal spray Place into the nose.    . ibuprofen (ADVIL,MOTRIN) 200 MG tablet Take by mouth.    . Multiple Vitamin (MULTI-VITAMINS) TABS Take by mouth.     No current facility-administered medications on file prior to visit.    There are no Patient Instructions on file for this visit. No follow-ups on file.   Georgiana Spinner, NP

## 2019-07-29 NOTE — Telephone Encounter (Signed)
The patient presents today as a referral from her primary care physician Dr. Judithann Sheen in regards to the patient's lower extremity pain and discomfort.  The patient notes that she is having lots of leg pain for approximately the last 2 years or so.  The patient notes that the pain is burning and stabbing.  The pain is constant.  It happens to be in her legs and feet.  She denies having any extensive swelling of her feet.  The patient notes that her occupation requires her to stand for long periods and over the years this is gotten worse.  The patient notes that there is even some discoloration in her feet as it has progressed.  The patient notes that she has multiple spider varicosities as well.  Previously a left lower extremity endovenous laser ablation was attempted however during the procedure the great saphenous vein was not easily visualized.  She denies any hemorrhaging.  She denies any venous ulceration.  Today noninvasive studies show no evidence of DVT or superficial venous stenosis in the bilateral lower extremities.  No evidence of deep venous insufficiency seen bilaterally in the lower extremities.  No evidence of superficial venous reflux seen in either the great or short saphenous veins bilaterally.

## 2019-11-10 ENCOUNTER — Ambulatory Visit
Admission: RE | Admit: 2019-11-10 | Discharge: 2019-11-10 | Disposition: A | Discharge status: Home / Self Care | Payer: BC Managed Care – PPO | Source: Ambulatory Visit | Attending: Obstetrics and Gynecology | Admitting: Obstetrics and Gynecology

## 2019-11-10 ENCOUNTER — Other Ambulatory Visit: Payer: Self-pay

## 2019-11-10 DIAGNOSIS — Z1231 Encounter for screening mammogram for malignant neoplasm of breast: Secondary | ICD-10-CM | POA: Insufficient documentation

## 2020-02-05 IMAGING — MG MM DIGITAL SCREENING BILAT W/ TOMO W/ CAD
8 series · 8 of 24 positions shown · non-contrast
Comparison: Previous exam(s).

CLINICAL DATA: Screening.

EXAM:
DIGITAL SCREENING BILATERAL MAMMOGRAM WITH TOMO AND CAD

[L CC synth-2D]
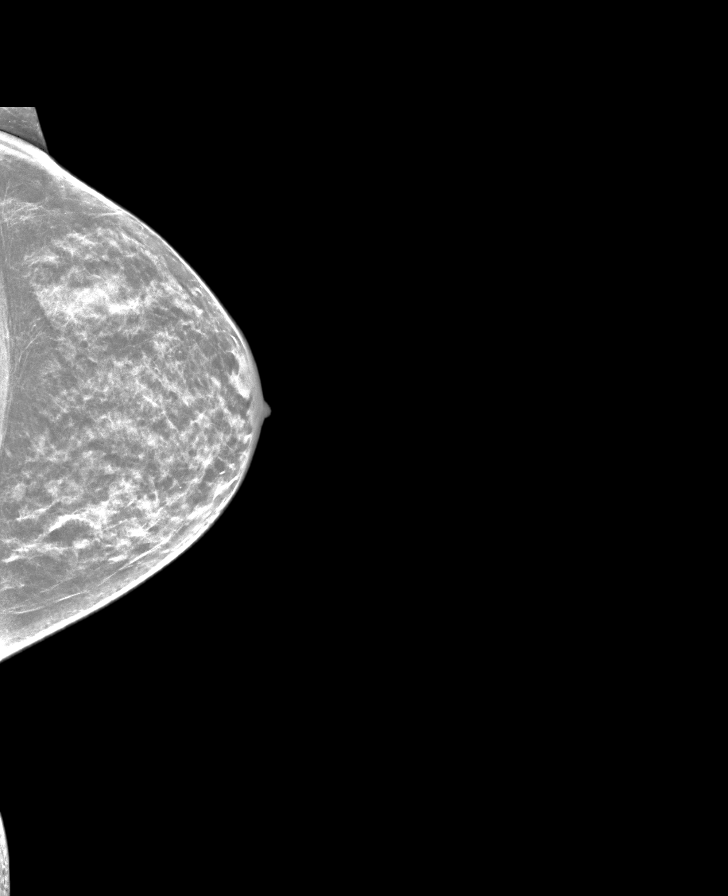

[R CC synth-2D]
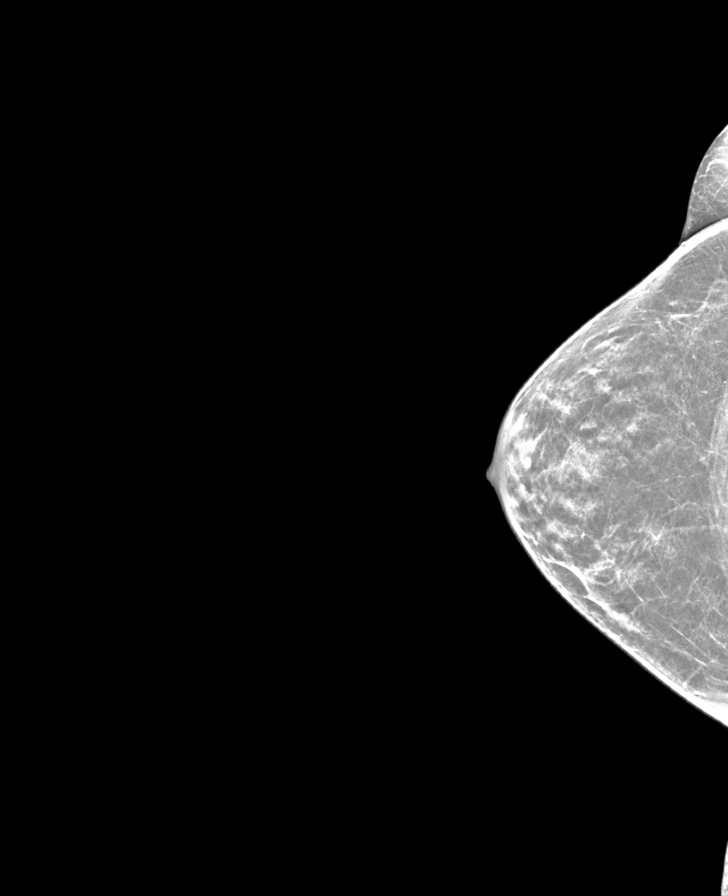

[L MLO synth-2D]
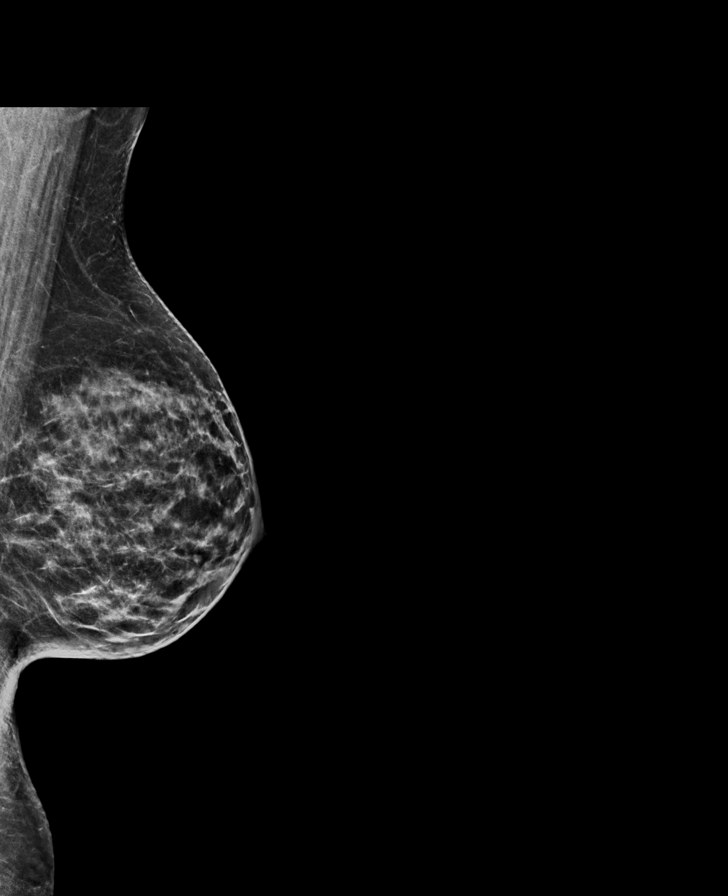

[R MLO synth-2D]
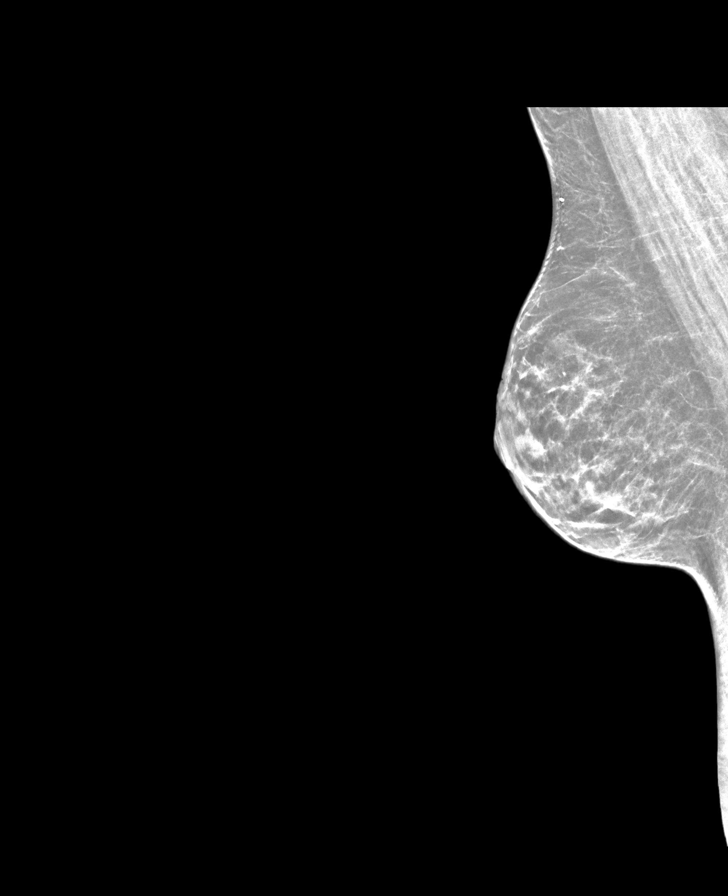

[L MLO tomo · tomo slice 31/60.0]
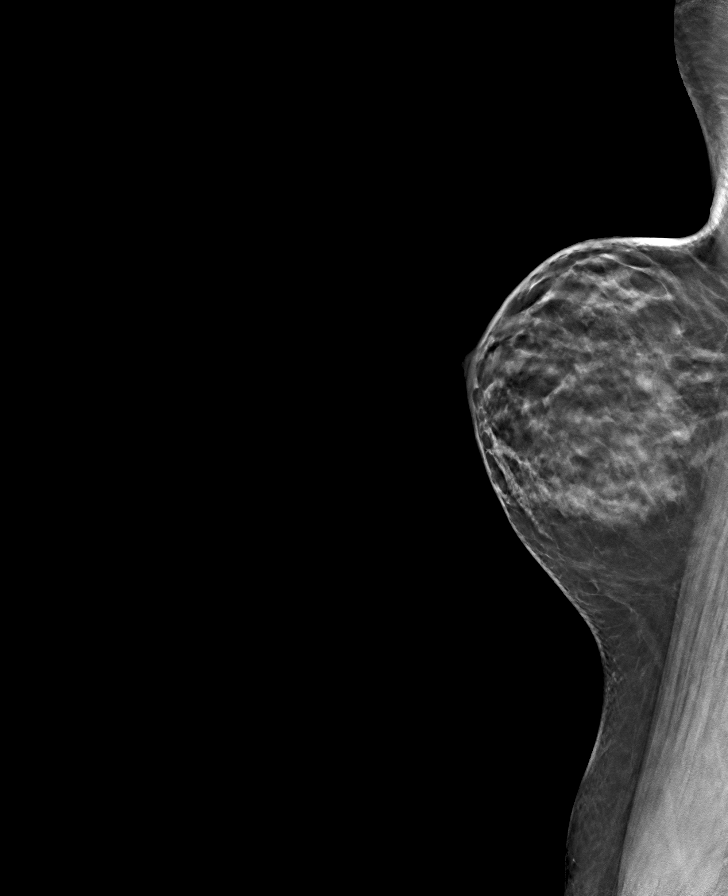

[R MLO tomo · tomo slice 29/57.0]
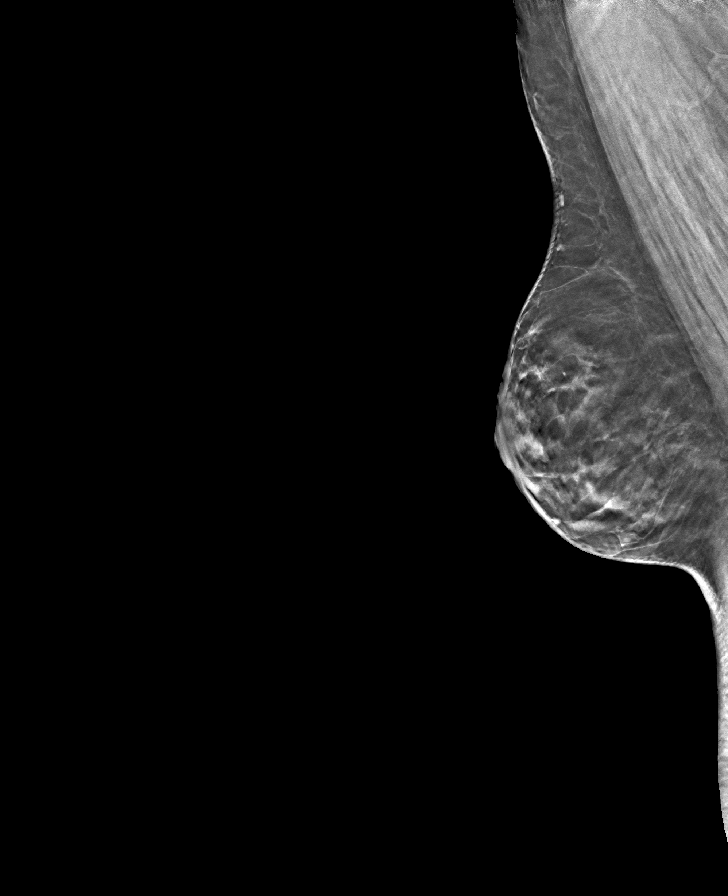

[R CC tomo · tomo slice 25/50.0]
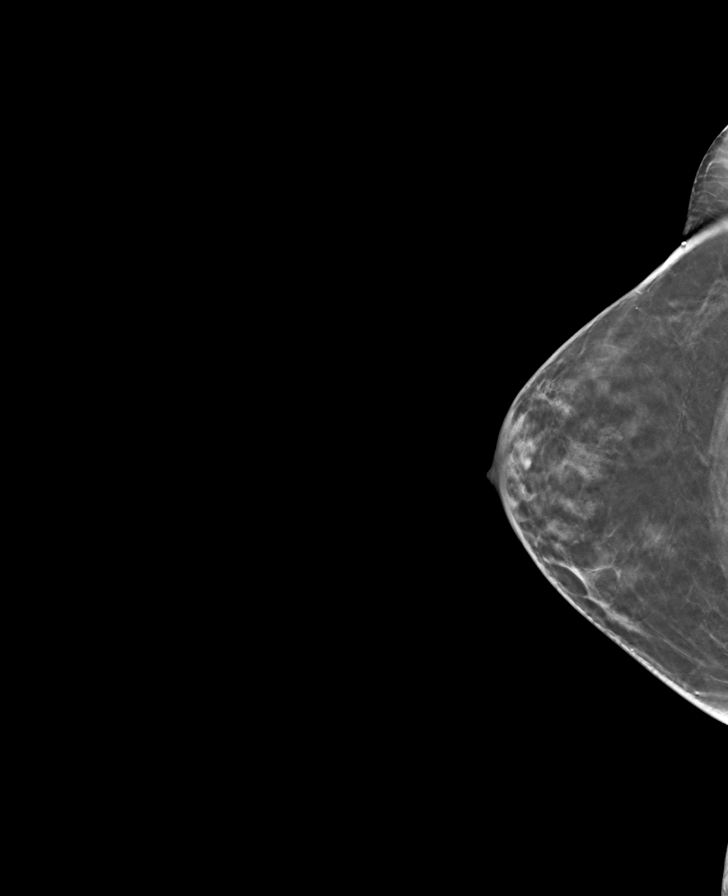

[L CC tomo · tomo slice 28/55.0]
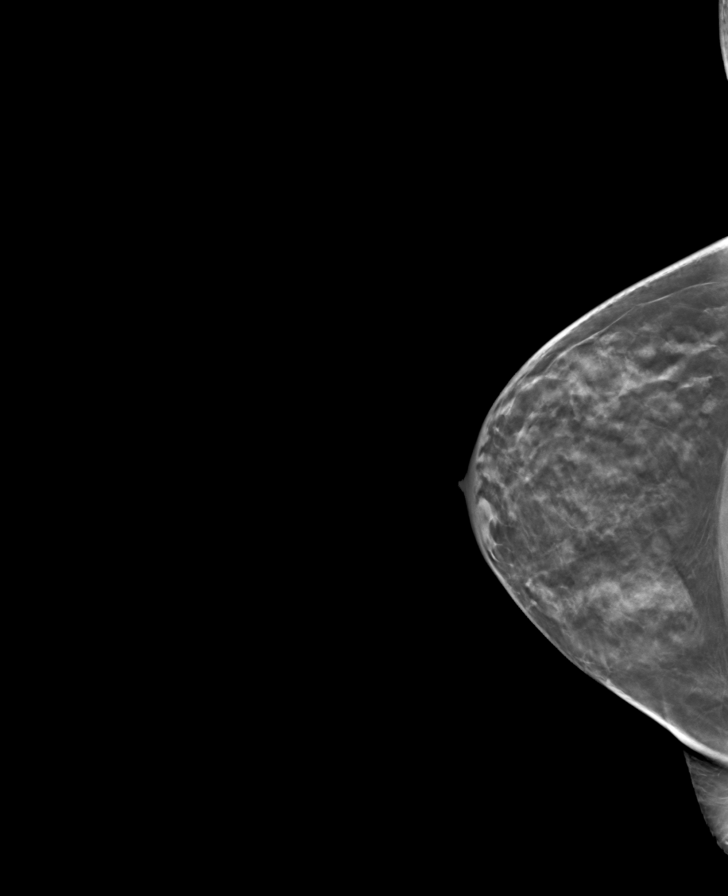

[8 of 24 positions shown; findings below may reference images not displayed]

ACR Breast Density Category c: The breast tissue is heterogeneously
dense, which may obscure small masses.
FINDINGS: There are no findings suspicious for malignancy. Images were
processed with CAD.
IMPRESSION: No mammographic evidence of malignancy. A result letter of this
screening mammogram will be mailed directly to the patient.

RECOMMENDATION:
Screening mammogram in one year. (Code:[id number])

BI-RADS CATEGORY  1: Negative.

## 2020-05-27 ENCOUNTER — Other Ambulatory Visit (HOSPITAL_COMMUNITY): Payer: Self-pay | Admitting: Internal Medicine

## 2020-05-27 ENCOUNTER — Other Ambulatory Visit: Payer: Self-pay | Admitting: Internal Medicine

## 2020-05-27 DIAGNOSIS — G8929 Other chronic pain: Secondary | ICD-10-CM

## 2020-06-29 ENCOUNTER — Other Ambulatory Visit: Payer: Self-pay | Admitting: Obstetrics and Gynecology

## 2020-06-29 DIAGNOSIS — Z1231 Encounter for screening mammogram for malignant neoplasm of breast: Secondary | ICD-10-CM

## 2020-06-30 ENCOUNTER — Other Ambulatory Visit: Payer: Self-pay | Admitting: Orthopedic Surgery

## 2020-06-30 DIAGNOSIS — M25562 Pain in left knee: Secondary | ICD-10-CM

## 2020-07-15 ENCOUNTER — Ambulatory Visit
Admission: RE | Admit: 2020-07-15 | Discharge: 2020-07-15 | Disposition: A | Discharge status: Home / Self Care | Payer: BC Managed Care – PPO | Source: Ambulatory Visit | Attending: Orthopedic Surgery | Admitting: Orthopedic Surgery

## 2020-07-15 ENCOUNTER — Other Ambulatory Visit: Payer: Self-pay

## 2020-07-15 DIAGNOSIS — M25562 Pain in left knee: Secondary | ICD-10-CM

## 2021-05-31 ENCOUNTER — Other Ambulatory Visit: Payer: Self-pay | Admitting: Internal Medicine

## 2021-05-31 DIAGNOSIS — Z1231 Encounter for screening mammogram for malignant neoplasm of breast: Secondary | ICD-10-CM

## 2021-10-27 ENCOUNTER — Ambulatory Visit
Admission: RE | Admit: 2021-10-27 | Discharge: 2021-10-27 | Disposition: A | Discharge status: Home / Self Care | Payer: BC Managed Care – PPO | Source: Ambulatory Visit | Attending: Internal Medicine | Admitting: Internal Medicine

## 2021-10-27 DIAGNOSIS — Z1231 Encounter for screening mammogram for malignant neoplasm of breast: Secondary | ICD-10-CM | POA: Diagnosis present

## 2022-06-15 ENCOUNTER — Encounter: Payer: Self-pay | Admitting: Gastroenterology

## 2022-06-15 NOTE — H&P (Signed)
Pre-Procedure H&P   Patient ID: Kelly Mckenzie is a 47 y.o. female.  Gastroenterology Provider: Jaynie Collins, DO  Referring Provider: Dr. Judithann Mckenzie PCP: Kelly Arbour, MD  Date: 06/16/2022  HPI [name] is a 47 y.o. female who presents today for Colonoscopy for Colorectal cancer screening .  Interview was conducted via medical Spanish interpreter  Reports twice a day bowel movement.  No melena or hematochezia.  Status post C-section x 2.  No family history of colon cancer or colon polyps.  Creatinine 0.6 hemoglobin 13.3 MCV 89 platelets 384,000   Past Medical History:  Diagnosis Date   Medical history non-contributory    Peripheral vascular disease (HCC)     Past Surgical History:  Procedure Laterality Date   CESAREAN SECTION      Family History No h/o GI disease or malignancy  Review of Systems  Constitutional:  Negative for activity change, appetite change, chills, diaphoresis, fatigue, fever and unexpected weight change.  HENT:  Negative for trouble swallowing and voice change.   Respiratory:  Negative for shortness of breath and wheezing.   Cardiovascular:  Negative for chest pain, palpitations and leg swelling.  Gastrointestinal:  Negative for abdominal distention, abdominal pain, anal bleeding, blood in stool, constipation, diarrhea, nausea, rectal pain and vomiting.  Musculoskeletal:  Negative for arthralgias and myalgias.  Skin:  Negative for color change and pallor.  Neurological:  Negative for dizziness, syncope and weakness.  Psychiatric/Behavioral:  Negative for confusion.   All other systems reviewed and are negative.    Medications No current facility-administered medications on file prior to encounter.   Current Outpatient Medications on File Prior to Encounter  Medication Sig Dispense Refill   fluticasone (FLONASE) 50 MCG/ACT nasal spray Place into the nose.     ibuprofen (ADVIL,MOTRIN) 200 MG tablet Take by  mouth.     Multiple Vitamin (MULTI-VITAMINS) TABS Take by mouth.      Pertinent medications related to GI and procedure were reviewed by me with the patient prior to the procedure   Current Facility-Administered Medications:    0.9 %  sodium chloride infusion, , Intravenous, Continuous, Jaynie Collins, DO  sodium chloride         No Known Allergies Allergies were reviewed by me prior to the procedure  Objective   Body mass index is 26.64 kg/m. Vitals:   06/16/22 0843  BP: (!) 127/59  Pulse: (!) 55  Resp: 20  Temp: (!) 96.7 F (35.9 C)  TempSrc: Temporal  SpO2: 100%  Weight: 70.4 kg  Height: 5\' 4"  (1.626 m)     Physical Exam Vitals and nursing note reviewed.  Constitutional:      General: She is not in acute distress.    Appearance: Normal appearance. She is not ill-appearing, toxic-appearing or diaphoretic.  HENT:     Head: Normocephalic and atraumatic.     Nose: Nose normal.     Mouth/Throat:     Mouth: Mucous membranes are moist.     Pharynx: Oropharynx is clear.  Eyes:     General: No scleral icterus.    Extraocular Movements: Extraocular movements intact.  Cardiovascular:     Rate and Rhythm: Regular rhythm. Bradycardia present.     Heart sounds: Normal heart sounds. No murmur heard.    No friction rub. No gallop.  Pulmonary:     Effort: Pulmonary effort is normal. No respiratory distress.     Breath sounds: Normal breath sounds. No wheezing, rhonchi or  rales.  Abdominal:     General: Bowel sounds are normal. There is no distension.     Palpations: Abdomen is soft.     Tenderness: There is no abdominal tenderness. There is no guarding or rebound.  Musculoskeletal:     Cervical back: Neck supple.     Right lower leg: No edema.     Left lower leg: No edema.  Skin:    General: Skin is warm and dry.     Coloration: Skin is not jaundiced or pale.  Neurological:     General: No focal deficit present.     Mental Status: She is alert and oriented  to person, place, and time. Mental status is at baseline.  Psychiatric:        Mood and Affect: Mood normal.        Behavior: Behavior normal.        Thought Content: Thought content normal.        Judgment: Judgment normal.      Assessment:  [name] is a 47 y.o. female  who presents today for Colonoscopy for Colorectal cancer screening .  Plan:  Colonoscopy with possible intervention today  Colonoscopy with possible biopsy, control of bleeding, polypectomy, and interventions as necessary has been discussed with the patient/patient representative. Informed consent was obtained from the patient/patient representative after explaining the indication, nature, and risks of the procedure including but not limited to death, bleeding, perforation, missed neoplasm/lesions, cardiorespiratory compromise, and reaction to medications. Opportunity for questions was given and appropriate answers were provided. Patient/patient representative has verbalized understanding is amenable to undergoing the procedure.   Jaynie Collins, DO  [facility]astroenter[facility]  Portions of the record may have been created with voice recognition software. Occasional wrong-word or 'sound-a-like' substitutions may have occurred due to the inherent limitations of voice recognition software.  Read the chart carefully and recognize, using context, where substitutions may have occurred.

## 2022-06-16 ENCOUNTER — Ambulatory Visit: Payer: BC Managed Care – PPO | Admitting: Certified Registered Nurse Anesthetist

## 2022-06-16 ENCOUNTER — Encounter: Payer: Self-pay | Admitting: Gastroenterology

## 2022-06-16 ENCOUNTER — Encounter
Admission: RE | Disposition: A | Discharge status: Home / Self Care | Payer: Self-pay | Source: Home / Self Care | Attending: Gastroenterology

## 2022-06-16 ENCOUNTER — Ambulatory Visit
Admission: RE | Admit: 2022-06-16 | Discharge: 2022-06-16 | Disposition: A | Discharge status: Home / Self Care | Payer: BC Managed Care – PPO | Attending: Gastroenterology | Admitting: Gastroenterology

## 2022-06-16 DIAGNOSIS — K621 Rectal polyp: Secondary | ICD-10-CM | POA: Diagnosis not present

## 2022-06-16 DIAGNOSIS — K219 Gastro-esophageal reflux disease without esophagitis: Secondary | ICD-10-CM | POA: Insufficient documentation

## 2022-06-16 DIAGNOSIS — I739 Peripheral vascular disease, unspecified: Secondary | ICD-10-CM | POA: Diagnosis not present

## 2022-06-16 DIAGNOSIS — Z1211 Encounter for screening for malignant neoplasm of colon: Secondary | ICD-10-CM | POA: Diagnosis present

## 2022-06-16 HISTORY — PX: COLONOSCOPY: SHX5424

## 2022-06-16 LAB — POCT PREGNANCY, URINE: Preg Test, Ur: NEGATIVE

## 2022-06-16 SURGERY — COLONOSCOPY
Anesthesia: General

## 2022-06-16 MED ORDER — PROPOFOL 500 MG/50ML IV EMUL
INTRAVENOUS | Status: DC | PRN
  Administered 2022-06-16: 150 ug/kg/min via INTRAVENOUS

## 2022-06-16 MED ORDER — SODIUM CHLORIDE 0.9 % IV SOLN
INTRAVENOUS | Status: DC
  Administered 2022-06-16: 20 mL/h via INTRAVENOUS

## 2022-06-16 MED ORDER — PROPOFOL 10 MG/ML IV BOLUS
INTRAVENOUS | Status: DC | PRN
  Administered 2022-06-16: 90 mg via INTRAVENOUS

## 2022-06-16 MED ORDER — LIDOCAINE HCL (CARDIAC) PF 100 MG/5ML IV SOSY
PREFILLED_SYRINGE | INTRAVENOUS | Status: DC | PRN
  Administered 2022-06-16: 50 mg via INTRAVENOUS

## 2022-06-16 NOTE — Anesthesia Postprocedure Evaluation (Signed)
Anesthesia Post Note  Patient: Kelly Mckenzie  Procedure(s) Performed: COLONOSCOPY  Patient location during evaluation: Endoscopy Anesthesia Type: General Level of consciousness: awake and alert Pain management: pain level controlled Vital Signs Assessment: post-procedure vital signs reviewed and stable Respiratory status: spontaneous breathing, nonlabored ventilation, respiratory function stable and patient connected to nasal cannula oxygen Cardiovascular status: blood pressure returned to baseline and stable Postop Assessment: no apparent nausea or vomiting Anesthetic complications: no   No notable events documented.   Last Vitals:  Vitals:   06/16/22 0937 06/16/22 0938  BP: (!) 100/50 (!) 100/50  Pulse:  66  Resp: 20 19  Temp: (!) 36.1 C   SpO2: 100% 100%    Last Pain:  Vitals:   06/16/22 0957  TempSrc:   PainSc: 0-No pain                 Lenard Simmer

## 2022-06-16 NOTE — Transfer of Care (Signed)
Immediate Anesthesia Transfer of Care Note  Patient: Kelly Mckenzie  Procedure(s) Performed: COLONOSCOPY  Patient Location: PACU and Endoscopy Unit  Anesthesia Type:General  Level of Consciousness: awake, alert , and oriented  Airway & Oxygen Therapy: Patient Spontanous Breathing  Post-op Assessment: Report given to RN and Post -op Vital signs reviewed and stable  Post vital signs: Reviewed and stable  Last Vitals:  Vitals Value Taken Time  BP 100/50 06/16/22 0938  Temp 36.1 C 06/16/22 0937  Pulse 66 06/16/22 0938  Resp 17 06/16/22 0938  SpO2 100 % 06/16/22 0938  Vitals shown include unvalidated device data.  Last Pain:  Vitals:   06/16/22 0937  TempSrc: Temporal  PainSc: 0-No pain         Complications: No notable events documented.

## 2022-06-16 NOTE — Interval H&P Note (Signed)
History and Physical Interval Note: Preprocedure H&P from 06/16/22  was reviewed and there was no interval change after seeing and examining the patient.  Written consent was obtained from the patient after discussion of risks, benefits, and alternatives. Patient has consented to proceed with Colonoscopy with possible intervention    06/16/2022 9:02 AM  Kelly Mckenzie  has presented today for surgery, with the diagnosis of Colon cancer screening (Z12.11).  The various methods of treatment have been discussed with the patient and family. After consideration of risks, benefits and other options for treatment, the patient has consented to  Procedure(s) with comments: COLONOSCOPY (N/A) - SPANISH INTERPRETER as a surgical intervention.  The patient's history has been reviewed, patient examined, no change in status, stable for surgery.  I have reviewed the patient's chart and labs.  Questions were answered to the patient's satisfaction.     Jaynie Collins

## 2022-06-16 NOTE — Anesthesia Procedure Notes (Signed)
Date/Time: 06/16/2022 9:10 AM  Performed by: Ginger Carne, CRNAPre-anesthesia Checklist: Patient identified, Emergency Drugs available, Suction available, Patient being monitored and Timeout performed Patient Re-evaluated:Patient Re-evaluated prior to induction Oxygen Delivery Method: Nasal cannula Preoxygenation: Pre-oxygenation with 100% oxygen Induction Type: IV induction

## 2022-06-16 NOTE — Anesthesia Preprocedure Evaluation (Signed)
Anesthesia Evaluation  Patient identified by MRN, date of birth, ID band Patient awake    Reviewed: Allergy & Precautions, H&P , NPO status , Patient's Chart, lab work & pertinent test results, reviewed documented beta blocker date and time   History of Anesthesia Complications Negative for: history of anesthetic complications  Airway Mallampati: II  TM Distance: >3 FB Neck ROM: full    Dental  (+) Dental Advidsory Given   Pulmonary neg pulmonary ROS   Pulmonary exam normal breath sounds clear to auscultation       Cardiovascular Exercise Tolerance: Good negative cardio ROS Normal cardiovascular exam Rhythm:regular Rate:Normal     Neuro/Psych negative neurological ROS  negative psych ROS   GI/Hepatic Neg liver ROS,GERD  Medicated,,  Endo/Other  negative endocrine ROS    Renal/GU negative Renal ROS  negative genitourinary   Musculoskeletal   Abdominal   Peds  Hematology negative hematology ROS (+)   Anesthesia Other Findings Past Medical History: No date: Medical history non-contributory No date: Peripheral vascular disease (HCC)   Reproductive/Obstetrics negative OB ROS                             Anesthesia Physical Anesthesia Plan  ASA: 2  Anesthesia Plan: General   Post-op Pain Management:    Induction: Intravenous  PONV Risk Score and Plan: 3 and Propofol infusion and TIVA  Airway Management Planned: Natural Airway and Nasal Cannula  Additional Equipment:   Intra-op Plan:   Post-operative Plan:   Informed Consent: I have reviewed the patients History and Physical, chart, labs and discussed the procedure including the risks, benefits and alternatives for the proposed anesthesia with the patient or authorized representative who has indicated his/her understanding and acceptance.     Dental Advisory Given  Plan Discussed with: Anesthesiologist, CRNA and  Surgeon  Anesthesia Plan Comments:        Anesthesia Quick Evaluation

## 2022-06-16 NOTE — Op Note (Signed)
[facility] Gastroenterology Patient Name: Kelly Mckenzie Procedure Date: 06/16/2022 8:54 AM MRN: [id number] Account #: [id number] Date of Birth: 26-Jun-1975 Admit Type: Outpatient Age: 47 Room: [facility] ENDO ROOM 1 Gender: Female Note Status: Finalized Instrument Name: Colonoscope [id number] Procedure:             Colonoscopy Indications:           Screening for colorectal malignant neoplasm Providers:             Trenda Moots, DO Referring MD:          Duane Lope. Judithann Sheen, MD (Referring MD) Medicines:             Monitored Anesthesia Care Complications:         No immediate complications. Estimated blood loss:                         Minimal. Procedure:             Pre-Anesthesia Assessment:                        - Prior to the procedure, a History and Physical was                         performed, and patient medications and allergies were                         reviewed. The patient is competent. The risks and                         benefits of the procedure and the sedation options and                         risks were discussed with the patient. All questions                         were answered and informed consent was obtained.                         Patient identification and proposed procedure were                         verified by the physician, the nurse, the anesthetist                         and the technician in the endoscopy suite. Mental                         Status Examination: alert and oriented. Airway                         Examination: normal oropharyngeal airway and neck                         mobility. Respiratory Examination: clear to                         auscultation. CV Examination: RRR, no murmurs, no S3  or S4. Prophylactic Antibiotics: The patient does not                         require prophylactic antibiotics. Prior                         Anticoagulants: The patient has taken no  anticoagulant                         or antiplatelet agents. ASA Grade Assessment: II - A                         patient with mild systemic disease. After reviewing                         the risks and benefits, the patient was deemed in                         satisfactory condition to undergo the procedure. The                         anesthesia plan was to use monitored anesthesia care                         (MAC). Immediately prior to administration of                         medications, the patient was re-assessed for adequacy                         to receive sedatives. The heart rate, respiratory                         rate, oxygen saturations, blood pressure, adequacy of                         pulmonary ventilation, and response to care were                         monitored throughout the procedure. The physical                         status of the patient was re-assessed after the                         procedure.                        After obtaining informed consent, the colonoscope was                         passed under direct vision. Throughout the procedure,                         the patient's blood pressure, pulse, and oxygen                         saturations were monitored continuously. The  Colonoscope was introduced through the anus and                         advanced to the the terminal ileum, with                         identification of the appendiceal orifice and IC                         valve. The colonoscopy was performed without                         difficulty. The patient tolerated the procedure well.                         The terminal ileum, ileocecal valve, appendiceal                         orifice, and rectum were photographed. The quality of                         the bowel preparation was evaluated using the BBPS                         [facility] Bowel Preparation Scale) with scores of: Right                          Colon = 2 (minor amount of residual staining, small                         fragments of stool and/or opaque liquid, but mucosa                         seen well), Transverse Colon = 3 (entire mucosa seen                         well with no residual staining, small fragments of                         stool or opaque liquid) and Left Colon = 3 (entire                         mucosa seen well with no residual staining, small                         fragments of stool or opaque liquid). The total BBPS                         score equals 8. The quality of the bowel preparation                         was excellent. Findings:      The perianal and digital rectal examinations were normal. Pertinent       negatives include normal sphincter tone.      Retroflexion in the right colon was performed.      A 1 to 2 mm polyp was found in the rectum. The  polyp was sessile. The       polyp was removed with a jumbo cold forceps. Resection and retrieval       were complete. Estimated blood loss was minimal.      The exam was otherwise without abnormality on direct and retroflexion       views.      The terminal ileum appeared normal. Estimated blood loss: none. Impression:            - One 1 to 2 mm polyp in the rectum, removed with a                         jumbo cold forceps. Resected and retrieved.                        - The examination was otherwise normal on direct and                         retroflexion views.                        - The examined portion of the ileum was normal.                        -                        - Un plipo de 1 a 2 mm en el recto, extirpado con                         unas pinzas fras gigantes. Resecado y recuperado.                        - Por lo dems, el examen fue normal en las vistas                         directa y en retroflexin.                        - La porcin examinada del leon era normal. Recommendation:        - Patient has a  contact number available for                         emergencies. The signs and symptoms of potential                         delayed complications were discussed with the patient.                         Return to normal activities tomorrow. Written                         discharge instructions were provided to the patient.                        - Discharge patient to home.                        - Resume previous diet.                        -  Continue present medications.                        - Await pathology results.                        - Repeat colonoscopy for surveillance based on                         pathology results.                        - Return to referring physician as previously                         scheduled.                        - The findings and recommendations were discussed with                         the patient. Procedure Code(s):     --- Professional ---                        [phone], Colonoscopy, flexible; with biopsy, single or                         multiple Diagnosis Code(s):     --- Professional ---                        Z12.11, Encounter for screening for malignant neoplasm                         of colon                        D12.8, Benign neoplasm of rectum CPT copyright 2022 [employer]. All rights reserved. The codes documented in this report are preliminary and upon coder review may  be revised to meet current compliance requirements. Attending Participation:      I personally performed the entire procedure. Elfredia Nevins, DO Jaynie Collins DO, DO 06/16/2022 9:40:33 AM This report has been signed electronically. Number of Addenda: 0 Note Initiated On: 06/16/2022 8:54 AM Scope Withdrawal Time: 0 hours 13 minutes 39 seconds  Total Procedure Duration: 0 hours 17 minutes 57 seconds  Estimated Blood Loss:  Estimated blood loss was minimal.      [facility]

## 2022-06-19 ENCOUNTER — Encounter: Payer: Self-pay | Admitting: Gastroenterology

## 2022-06-20 LAB — SURGICAL PATHOLOGY

## 2022-06-27 ENCOUNTER — Other Ambulatory Visit: Payer: Self-pay | Admitting: *Deleted

## 2022-06-27 DIAGNOSIS — I83813 Varicose veins of bilateral lower extremities with pain: Secondary | ICD-10-CM

## 2022-07-07 ENCOUNTER — Ambulatory Visit (HOSPITAL_COMMUNITY)
Admission: RE | Admit: 2022-07-07 | Discharge: 2022-07-07 | Disposition: A | Discharge status: Home / Self Care | Payer: BC Managed Care – PPO | Source: Ambulatory Visit | Attending: Vascular Surgery | Admitting: Vascular Surgery

## 2022-07-07 ENCOUNTER — Ambulatory Visit: Payer: BC Managed Care – PPO | Admitting: Physician Assistant

## 2022-07-07 VITALS — BP 122/79 | HR 61 | Temp 98.1°F | Resp 20 | Ht 64.0 in | Wt 159.0 lb

## 2022-07-07 DIAGNOSIS — I83813 Varicose veins of bilateral lower extremities with pain: Secondary | ICD-10-CM

## 2022-07-07 DIAGNOSIS — I8393 Asymptomatic varicose veins of bilateral lower extremities: Secondary | ICD-10-CM

## 2022-07-07 NOTE — Progress Notes (Signed)
[facility]           [facility] [place]  History and Physical   Kelly Mckenzie is a 47 y.o. female who presents with BLE swelling.    Pt is spanish speaking and interpreter on pad was used.   She states that she has BLE swelling that is worse at the end of the day. She states that her legs are very achy as well.  This is improved after laying flat overnight.   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 She does not have hx of DVT.  She has not used compression socks in the past.  She does not have skin color changes.      The pt is not on a statin for cholesterol management.  The pt is not on a daily aspirin.   Other AC:  none The pt is not on medication for hypertension.   The pt is is not on medication for diabetes.   Tobacco hx:  never    Past Medical History:  Diagnosis Date   Medical history non-contributory    Peripheral vascular disease (HCC)     Past Surgical History:  Procedure Laterality Date   CESAREAN SECTION     COLONOSCOPY N/A 06/16/2022   Procedure: COLONOSCOPY;  Surgeon: Jaynie Collins, DO;  Location: [facility] ENDOSCOPY;  Service: Gastroenterology;  Laterality: N/A;  SPANISH INTERPRETER    Social History   Socioeconomic History   Marital status: Married    Spouse name: Not on file   Number of children: Not on file   Years of education: Not on file   Highest education level: Not on file  Occupational History   Not on file  Tobacco Use   Smoking status: Never   Smokeless tobacco: Never  Vaping Use   Vaping Use: Never used  Substance and Sexual Activity   Alcohol use: No   Drug use: No   Sexual activity: Not on file  Other Topics Concern   Not on file  Social History Narrative   Not on file   Social Determinants of Health   Financial Resource Strain: Not on file  Food Insecurity: Not on file  Transportation Needs: Not on file  Physical Activity: Not on file  Stress: Not on file  Social  Connections: Not on file  Intimate Partner Violence: Not on file     Family History  Problem Relation Age of Onset   Breast cancer Neg Hx     Current Outpatient Medications  Medication Sig Dispense Refill   fluticasone (FLONASE) 50 MCG/ACT nasal spray Place into the nose.     ibuprofen (ADVIL,MOTRIN) 200 MG tablet Take by mouth.     Multiple Vitamin (MULTI-VITAMINS) TABS Take by mouth.     No current facility-administered medications for this visit.    No Known Allergies  REVIEW OF SYSTEMS:   [X]  denotes positive finding, [ ]  denotes negative finding Cardiac  Comments:  Chest pain or chest pressure:    Shortness of breath upon exertion:    Short of breath when lying flat:    Irregular heart rhythm:        Vascular    Pain in calf, thigh, or hip brought on by ambulation:    Pain in feet at night that wakes you up from your sleep:     Blood clot in your veins:    Leg swelling:  x See HPI  Pulmonary    Oxygen at home:    Productive cough:     Wheezing:         Neurologic    Sudden weakness in arms or legs:     Sudden numbness in arms or legs:     Sudden onset of difficulty speaking or slurred speech:    Temporary loss of vision in one eye:     Problems with dizziness:         Gastrointestinal    Blood in stool:     Vomited blood:         Genitourinary    Burning when urinating:     Blood in urine:        Psychiatric    Major depression:         Hematologic    Bleeding problems:    Problems with blood clotting too easily:        Skin    Rashes or ulcers:        Constitutional    Fever or chills:      PHYSICAL EXAMINATION:  Today's Vitals   07/07/22 1335  BP: 122/79  Pulse: 61  Resp: 20  Temp: 98.1 F (36.7 C)  SpO2: 98%  Weight: 159 lb (72.1 kg)  Height: 5\' 4"  (1.626 m)   Body mass index is 27.29 kg/m.   General:  WDWN in NAD; vital signs documented above Gait: Not observed HENT: WNL, normocephalic Pulmonary: normal non-labored  breathing without wheezing Cardiac: regular HR; without carotid bruits Skin: without rashes Vascular Exam/Pulses:  Right Left  Radial 2+ (normal) 2+ (normal)  DP 2+ (normal) 2+ (normal)   Extremities: varicosities and spider veins present bilateral lower extremity  Neurologic: A&O X 3;  moving all extremities equally Psychiatric:  The pt has Normal affect.   Non-Invasive Vascular Imaging:   Venous duplex on 07/07/2022: Venous Reflux Times  +--------------+---------+------+-----------+------------+--------+  RIGHT        Reflux NoRefluxReflux TimeDiameter cmsComments                          Yes                                   +--------------+---------+------+-----------+------------+--------+  CFV                    yes   >1 second                       +--------------+---------+------+-----------+------------+--------+  FV prox       no                                              +--------------+---------+------+-----------+------------+--------+  FV mid        no                                              +--------------+---------+------+-----------+------------+--------+  FV dist       no                                              +--------------+---------+------+-----------+------------+--------+  Popliteal    no                                              +--------------+---------+------+-----------+------------+--------+  GSV at [facility]              yes    >500 ms     0.852              +--------------+---------+------+-----------+------------+--------+  GSV prox thigh          yes    >500 ms     0.578              +--------------+---------+------+-----------+------------+--------+  GSV mid thigh no                           0.486              +--------------+---------+------+-----------+------------+--------+  GSV dist thighno                           0.474               +--------------+---------+------+-----------+------------+--------+  GSV at knee   no                           0.565              +--------------+---------+------+-----------+------------+--------+  GSV prox calf           yes    >500 ms     0.462              +--------------+---------+------+-----------+------------+--------+  SSV Pop Fossa no                           0.371              +--------------+---------+------+-----------+------------+--------+  SSV prox calf no                           0.265              +--------------+---------+------+-----------+------------+--------+  SSV mid calf  no                           0.256              +--------------+---------+------+-----------+------------+--------+   Summary:  Right:  - No evidence of deep vein thrombosis seen in the right lower extremity, from the common femoral through the popliteal veins.  - No evidence of superficial venous thrombosis in the right lower extremity.  -Deep vein reflux in the CFV.  - Superficial vein reflux in the SFJ, GSV prox thigh and prox calf.        Walta Cathia Eckenrode is a 47 y.o. female who presents with: BLE swelling with achiness and pain in both legs    -pt has easily palpable DP pedal pulses bilaterally -pt does not have evidence of DVT.  Pt does have venous reflux in the deep venous system as well as the GSV at the [facility] and in the proximal thigh and calf.  She appears she would be a candidate for laser ablation.   -discussed with pt about wearing thigh  high 20-30 mmHg compression stockings and pt was measured for these today.    -discussed the importance of leg elevation and how to elevate properly - pt is advised to elevate their legs and a diagram is given to them to demonstrate for pt to lay flat on their back with knees elevated and slightly bent with their feet higher than their knees, which puts their feet higher than their heart for 15 minutes per  day.  If pt cannot lay flat, advised to lay as flat as possible.  -pt is advised to continue as much walking as possible and avoid sitting or standing for long periods of time.  -discussed importance of weight loss and exercise and that water aerobics would also be beneficial.  -handout with recommendations given in spanish. -pt will f/u 3 months with vein MD.  At that time, we will get venous duplex of the left leg.    Doreatha Massed, [facility] [phone]  Clinic MD:  Karin Lieu on call MD

## 2022-07-10 ENCOUNTER — Other Ambulatory Visit: Payer: Self-pay | Admitting: *Deleted

## 2022-07-10 DIAGNOSIS — I83813 Varicose veins of bilateral lower extremities with pain: Secondary | ICD-10-CM

## 2022-10-19 ENCOUNTER — Other Ambulatory Visit: Payer: Self-pay | Admitting: Obstetrics and Gynecology

## 2022-10-19 DIAGNOSIS — Z1231 Encounter for screening mammogram for malignant neoplasm of breast: Secondary | ICD-10-CM

## 2022-10-23 ENCOUNTER — Ambulatory Visit: Payer: 59 | Admitting: Surgery

## 2022-10-23 ENCOUNTER — Ambulatory Visit (HOSPITAL_COMMUNITY): Payer: BC Managed Care – PPO

## 2022-10-30 ENCOUNTER — Ambulatory Visit
Admission: RE | Admit: 2022-10-30 | Discharge: 2022-10-30 | Disposition: A | Discharge status: Home / Self Care | Payer: BC Managed Care – PPO | Source: Ambulatory Visit | Attending: Internal Medicine | Admitting: Internal Medicine

## 2022-10-30 DIAGNOSIS — Z1231 Encounter for screening mammogram for malignant neoplasm of breast: Secondary | ICD-10-CM

## 2023-09-27 ENCOUNTER — Other Ambulatory Visit: Payer: Self-pay | Admitting: Obstetrics and Gynecology

## 2023-09-27 DIAGNOSIS — Z1231 Encounter for screening mammogram for malignant neoplasm of breast: Secondary | ICD-10-CM

## 2023-10-29 ENCOUNTER — Ambulatory Visit
Admission: RE | Admit: 2023-10-29 | Discharge: 2023-10-29 | Disposition: A | Source: Ambulatory Visit | Attending: Internal Medicine | Admitting: Internal Medicine

## 2023-10-29 DIAGNOSIS — Z1231 Encounter for screening mammogram for malignant neoplasm of breast: Secondary | ICD-10-CM
